# Patient Record
Sex: Female | Born: 2000 | Race: Black or African American | Hispanic: No | Marital: Single | Smoking: Current every day smoker
Health system: Southern US, Community
[De-identification: ages and names within clinical notes are randomized; demographics above are authoritative.]

## PROBLEM LIST (undated history)

## (undated) DIAGNOSIS — J45909 Unspecified asthma, uncomplicated: Secondary | ICD-10-CM

## (undated) DIAGNOSIS — K219 Gastro-esophageal reflux disease without esophagitis: Secondary | ICD-10-CM

## (undated) DIAGNOSIS — D509 Iron deficiency anemia, unspecified: Secondary | ICD-10-CM

## (undated) HISTORY — DX: Hypercalcemia: E83.52

## (undated) HISTORY — DX: Iron deficiency anemia, unspecified: D50.9

---
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
## 2020-04-23 ENCOUNTER — Encounter (HOSPITAL_COMMUNITY): Payer: Self-pay | Admitting: *Deleted

## 2020-04-23 ENCOUNTER — Other Ambulatory Visit: Payer: Self-pay

## 2020-04-23 ENCOUNTER — Ambulatory Visit (INDEPENDENT_AMBULATORY_CARE_PROVIDER_SITE_OTHER): Payer: Medicaid Other

## 2020-04-23 ENCOUNTER — Ambulatory Visit (HOSPITAL_COMMUNITY)
Admission: EM | Admit: 2020-04-23 | Discharge: 2020-04-23 | Disposition: A | Discharge status: Home / Self Care | Payer: Medicaid Other | Attending: Family Medicine | Admitting: Family Medicine

## 2020-04-23 DIAGNOSIS — W19XXXA Unspecified fall, initial encounter: Secondary | ICD-10-CM | POA: Diagnosis not present

## 2020-04-23 DIAGNOSIS — M25562 Pain in left knee: Secondary | ICD-10-CM

## 2020-04-23 DIAGNOSIS — S83002A Unspecified subluxation of left patella, initial encounter: Secondary | ICD-10-CM

## 2020-04-23 LAB — POC URINE PREG, ED: Preg Test, Ur: NEGATIVE

## 2020-04-23 MED ORDER — IBUPROFEN 600 MG PO TABS: 600.0000 mg | ORAL_TABLET | Freq: Four times a day (QID) | ORAL | 0 refills | Status: DC | PRN

## 2020-04-23 NOTE — ED Provider Notes (Signed)
[facility]    CSN: [id number] Arrival date & time: 04/23/20  1702      History   Chief Complaint Chief Complaint  Patient presents with  . Knee Pain    LT    HPI Michelle Mcfarland is a 19 y.o. female.   HPI   Patient states she was up on a ladder, putting up lights lights, 2 days ago.  She fell.  She states that she landed on her left leg and twisted causing her to collapse.  Ever since then she has had pain in her knee.  She states that when she puts weight on it it feels unsteady.  She feels like it is "going in and out of place".  She has never had any knee problems before.  She has been wearing a brace.  She is limited weight bearing.  She used ice initially and then peaked.  She is taking ibuprofen for pain  History reviewed. No pertinent past medical history.  There are no problems to display for this patient.   History reviewed. No pertinent surgical history.  OB History   No obstetric history on file.      Home Medications    Prior to Admission medications   Medication Sig Start Date End Date Taking? Authorizing Provider  ibuprofen (ADVIL) 600 MG tablet Take 1 tablet (600 mg total) by mouth every 6 (six) hours as needed. 04/23/20   Eustace Moore, MD    Family History History reviewed. No pertinent family history.  Social History Social History   Tobacco Use  . Smoking status: Never Smoker  . Smokeless tobacco: Never Used  Vaping Use  . Vaping Use: Never used  Substance Use Topics  . Alcohol use: No  . Drug use: No     Allergies   Patient has no known allergies.   Review of Systems Review of Systems  See HPI Physical Exam Triage Vital Signs ED Triage Vitals [04/23/20 1818]  Enc Vitals Group     BP      Pulse      Resp      Temp      Temp src      SpO2      Weight 160 lb (72.6 kg)     Height 5\' 4"  (1.626 m)     Head Circumference      Peak Flow      Pain Score 9     Pain Loc      Pain Edu?      Excl. in GC?     No data found.  Updated Vital Signs Ht 5\' 4"  (1.626 m)   Wt 72.6 kg   LMP 04/06/2020   BMI 27.46 kg/m       Physical Exam Constitutional:      General: She is not in acute distress.    Appearance: She is well-developed.  HENT:     Head: Normocephalic and atraumatic.  Eyes:     Conjunctiva/sclera: Conjunctivae normal.     Pupils: Pupils are equal, round, and reactive to light.  Cardiovascular:     Rate and Rhythm: Normal rate.  Pulmonary:     Effort: Pulmonary effort is normal. No respiratory distress.  Abdominal:     General: There is no distension.     Palpations: Abdomen is soft.  Musculoskeletal:        General: Normal range of motion.     Cervical back: Normal range of motion.  Comments: Left knee is in a neoprene sleeve.  Upon removal there is no obvious bruising or deformity, mild warmth.  There is mild patellofemoral crepitus.  Patient resists any range of motion.  She has tenderness over the patella, lateral and medial joint lines.  No instability to examination.  Skin:    General: Skin is warm and dry.  Neurological:     Mental Status: She is alert.      UC Treatments / Results  Labs (all labs ordered are listed, but only abnormal results are displayed) Labs Reviewed  POC URINE PREG, ED    EKG   Radiology DG Knee Complete 4 Views Left  Result Date: 04/23/2020 CLINICAL DATA:  Fall, knee pain EXAM: LEFT KNEE - COMPLETE 4+ VIEW COMPARISON:  None. FINDINGS: No evidence of fracture, dislocation, or joint effusion. No evidence of arthropathy or other focal bone abnormality. Soft tissues are unremarkable. IMPRESSION: Negative. Electronically Signed   By: Charlett Nose M.D.   On: 04/23/2020 19:23    Procedures Procedures (including critical care time)  Medications Ordered in UC Medications - No data to display  Initial Impression / Assessment and Plan / UC Course  I have reviewed the triage vital signs and the nursing notes.  Pertinent labs &  imaging results that were available during my care of the patient were reviewed by me and considered in my medical decision making (see chart for details).    History and physical examination support a diagnosis of patellar subluxation, possible dislocation although patient states her knee never "deformed".  It is continuing symptomatic.  Will place into a knee immobilizer and have her follow-up with sports medicine Final Clinical Impressions(s) / UC Diagnoses   Final diagnoses:  Patellar subluxation, left, initial encounter  Acute pain of left knee     Discharge Instructions     Wear brace at all times that you are up and walking May open brace put ice pack on knee 20 minutes every couple of hours Take ibuprofen 3 times a day with food.  This is for pain and swelling Call sports medicine tomorrow to be seen for follow-up     ED Prescriptions    Medication Sig Dispense Auth. Provider   ibuprofen (ADVIL) 600 MG tablet Take 1 tablet (600 mg total) by mouth every 6 (six) hours as needed. 30 tablet Eustace Moore, MD     PDMP not reviewed this encounter.   Eustace Moore, MD 04/23/20 Serena Croissant

## 2020-04-23 NOTE — ED Triage Notes (Signed)
Pt reports 2 days ago she fell off a 2 step ladder while hanging LED lights. Pt presented with a knee sleeve todat . Pt observed to be limping when walking to room,

## 2020-04-23 NOTE — Discharge Instructions (Signed)
Wear brace at all times that you are up and walking May open brace put ice pack on knee 20 minutes every couple of hours Take ibuprofen 3 times a day with food.  This is for pain and swelling Call sports medicine tomorrow to be seen for follow-up

## 2020-07-23 ENCOUNTER — Encounter (HOSPITAL_COMMUNITY): Payer: Self-pay

## 2020-07-23 ENCOUNTER — Other Ambulatory Visit: Payer: Self-pay

## 2020-07-23 ENCOUNTER — Ambulatory Visit (HOSPITAL_COMMUNITY)
Admission: RE | Admit: 2020-07-23 | Discharge: 2020-07-23 | Disposition: A | Discharge status: Home / Self Care | Payer: 59 | Source: Ambulatory Visit | Attending: Urgent Care | Admitting: Urgent Care

## 2020-07-23 VITALS — BP 131/85 | HR 92 | Temp 98.9°F | Resp 17

## 2020-07-23 DIAGNOSIS — M25562 Pain in left knee: Secondary | ICD-10-CM

## 2020-07-23 MED ORDER — NAPROXEN 500 MG PO TABS: 500.0000 mg | ORAL_TABLET | Freq: Two times a day (BID) | ORAL | 0 refills | Status: DC

## 2020-07-23 NOTE — ED Triage Notes (Signed)
Pt presents with left knee pain. States was seen about 2 months ago for same problem and was given knee brace. States knee brace is hurting more than helping now. States ibuprofen is no longer giving relief.

## 2020-07-23 NOTE — ED Provider Notes (Signed)
  [facility]   MRN: [id number] DOB: [birth year]  Subjective:   Michelle Mcfarland is a 20 y.o. female presenting for 33-month history of persistent left knee pain swelling.  Patient states that she has worn the brace and use ibuprofen but has not gotten any relief.  She has difficulty flexing her knee fully.  Also has difficulty with walking.  Initially the injury happened from falling off of a ladder, states that she twisted her knee and also hit it on the way down.  Imaging was negative at her last visit.  Denies any further injury since then.  No current facility-administered medications for this encounter.  Current Outpatient Medications:  .  ibuprofen (ADVIL) 600 MG tablet, Take 1 tablet (600 mg total) by mouth every 6 (six) hours as needed., Disp: 30 tablet, Rfl: 0   No Known Allergies  History reviewed. No pertinent past medical history.   History reviewed. No pertinent surgical history.  History reviewed. No pertinent family history.  Social History   Tobacco Use  . Smoking status: Never Smoker  . Smokeless tobacco: Never Used  Vaping Use  . Vaping Use: Never used  Substance Use Topics  . Alcohol use: No  . Drug use: No    ROS   Objective:   Vitals: BP 131/85 (BP Location: Left Arm)   Pulse 92   Temp 98.9 F (37.2 C) (Oral)   Resp 17   LMP 07/02/2020   SpO2 98%   Physical Exam Constitutional:      General: She is not in acute distress.    Appearance: Normal appearance. She is well-developed. She is not ill-appearing.  HENT:     Head: Normocephalic and atraumatic.     Nose: Nose normal.     Mouth/Throat:     Mouth: Mucous membranes are moist.     Pharynx: Oropharynx is clear.  Eyes:     General: No scleral icterus.    Extraocular Movements: Extraocular movements intact.     Pupils: Pupils are equal, round, and reactive to light.  Cardiovascular:     Rate and Rhythm: Normal rate.  Pulmonary:     Effort: Pulmonary effort is normal.   Musculoskeletal:     Left knee: No swelling, deformity, effusion, erythema, ecchymosis, lacerations, bony tenderness or crepitus. Decreased range of motion (flexion). Tenderness (directly over patella) present over the patellar tendon. No medial joint line or lateral joint line tenderness. Abnormal patellar mobility. Normal alignment and normal meniscus.  Skin:    General: Skin is warm and dry.  Neurological:     General: No focal deficit present.     Mental Status: She is alert and oriented to person, place, and time.     Motor: No weakness.     Deep Tendon Reflexes: Reflexes normal.  Psychiatric:        Mood and Affect: Mood normal.        Behavior: Behavior normal.    Ace wrap applied to left knee.  Assessment and Plan :   PDMP not reviewed this encounter.  1. Acute pain of left knee     Deferred repeat x-ray, recommended further evaluation with MRI or ultrasound during orthopedic practice.  Use naproxen for pain and inflammation. Counseled patient on potential for adverse effects with medications prescribed/recommended today, ER and return-to-clinic precautions discussed, patient verbalized understanding.    Wallis Bamberg, PA-C 07/23/20 1021

## 2020-07-23 NOTE — Discharge Instructions (Signed)
You will need more imaging like an MRI or an ultrasound to look at your knee ligaments, meniscus. But in the meantime, wear the Ace wrap for stability. Reach out to one of the orthopedist practices to get a more in depth imaging study of your knee.

## 2020-07-24 ENCOUNTER — Other Ambulatory Visit: Payer: Self-pay | Admitting: Sports Medicine

## 2020-07-24 DIAGNOSIS — M25362 Other instability, left knee: Secondary | ICD-10-CM

## 2020-08-11 ENCOUNTER — Other Ambulatory Visit: Payer: Self-pay

## 2020-08-11 ENCOUNTER — Ambulatory Visit
Admission: RE | Admit: 2020-08-11 | Discharge: 2020-08-11 | Disposition: A | Discharge status: Home / Self Care | Payer: 59 | Source: Ambulatory Visit | Attending: Sports Medicine | Admitting: Sports Medicine

## 2020-08-11 DIAGNOSIS — M25362 Other instability, left knee: Secondary | ICD-10-CM

## 2020-10-23 ENCOUNTER — Ambulatory Visit (INDEPENDENT_AMBULATORY_CARE_PROVIDER_SITE_OTHER): Payer: 59

## 2020-10-23 ENCOUNTER — Ambulatory Visit (HOSPITAL_COMMUNITY)
Admission: RE | Admit: 2020-10-23 | Discharge: 2020-10-23 | Disposition: A | Discharge status: Home / Self Care | Payer: 59 | Source: Ambulatory Visit | Attending: Physician Assistant | Admitting: Physician Assistant

## 2020-10-23 ENCOUNTER — Encounter (HOSPITAL_COMMUNITY): Payer: Self-pay

## 2020-10-23 ENCOUNTER — Other Ambulatory Visit: Payer: Self-pay

## 2020-10-23 VITALS — BP 101/71 | HR 92 | Temp 98.8°F | Resp 18

## 2020-10-23 DIAGNOSIS — R042 Hemoptysis: Secondary | ICD-10-CM

## 2020-10-23 DIAGNOSIS — R0602 Shortness of breath: Secondary | ICD-10-CM

## 2020-10-23 DIAGNOSIS — R059 Cough, unspecified: Secondary | ICD-10-CM | POA: Diagnosis not present

## 2020-10-23 DIAGNOSIS — J329 Chronic sinusitis, unspecified: Secondary | ICD-10-CM | POA: Diagnosis not present

## 2020-10-23 DIAGNOSIS — J40 Bronchitis, not specified as acute or chronic: Secondary | ICD-10-CM | POA: Insufficient documentation

## 2020-10-23 LAB — COMPREHENSIVE METABOLIC PANEL
ALT: 19 U/L (ref 0–44)
AST: 21 U/L (ref 15–41)
Albumin: 3.8 g/dL (ref 3.5–5.0)
Alkaline Phosphatase: 75 U/L (ref 38–126)
Anion gap: 5 (ref 5–15)
BUN: 10 mg/dL (ref 6–20)
CO2: 25 mmol/L (ref 22–32)
Calcium: 9.1 mg/dL (ref 8.9–10.3)
Chloride: 106 mmol/L (ref 98–111)
Creatinine, Ser: 0.77 mg/dL (ref 0.44–1.00)
GFR, Estimated: >60 mL/min (ref >60)
Glucose, Bld: 83 mg/dL (ref 70–99)
Potassium: 4 mmol/L (ref 3.5–5.1)
Sodium: 136 mmol/L (ref 135–145)
Total Bilirubin: 1 mg/dL (ref 0.3–1.2)
Total Protein: 7.4 g/dL (ref 6.5–8.1)

## 2020-10-23 LAB — CBC WITH DIFFERENTIAL/PLATELET
Abs Immature Granulocytes: 0.02 10*3/uL (ref 0.00–0.07)
Basophils Absolute: 0 10*3/uL (ref 0.0–0.1)
Basophils Relative: 1 %
Eosinophils Absolute: 0.2 10*3/uL (ref 0.0–0.5)
Eosinophils Relative: 4 %
HCT: 36 % (ref 36.0–46.0)
Hemoglobin: 10.6 g/dL — ABNORMAL LOW (ref 12.0–15.0)
Immature Granulocytes: 0 %
Lymphocytes Relative: 35 %
Lymphs Abs: 1.9 10*3/uL (ref 0.7–4.0)
MCH: 19.9 pg — ABNORMAL LOW (ref 26.0–34.0)
MCHC: 29.4 g/dL — ABNORMAL LOW (ref 30.0–36.0)
MCV: 67.5 fL — ABNORMAL LOW (ref 80.0–100.0)
Monocytes Absolute: 0.4 10*3/uL (ref 0.1–1.0)
Monocytes Relative: 7 %
Neutro Abs: 2.8 10*3/uL (ref 1.7–7.7)
Neutrophils Relative %: 53 %
Platelets: 399 10*3/uL (ref 150–400)
RBC: 5.33 MIL/uL — ABNORMAL HIGH (ref 3.87–5.11)
RDW: 16.7 % — ABNORMAL HIGH (ref 11.5–15.5)
WBC: 5.4 10*3/uL (ref 4.0–10.5)
nRBC: 0 % (ref 0.0–0.2)

## 2020-10-23 MED ORDER — AMOXICILLIN-POT CLAVULANATE 875-125 MG PO TABS: 1.0000 | ORAL_TABLET | Freq: Two times a day (BID) | ORAL | 0 refills | Status: DC

## 2020-10-23 MED ORDER — ALBUTEROL SULFATE HFA 108 (90 BASE) MCG/ACT IN AERS: 1.0000 | INHALATION_SPRAY | Freq: Four times a day (QID) | RESPIRATORY_TRACT | 0 refills | Status: DC | PRN

## 2020-10-23 MED ORDER — PREDNISONE 20 MG PO TABS: 40.0000 mg | ORAL_TABLET | Freq: Every day | ORAL | 0 refills | Status: AC

## 2020-10-23 MED ORDER — PROMETHAZINE-DM 6.25-15 MG/5ML PO SYRP: 5.0000 mL | ORAL_SOLUTION | Freq: Every evening | ORAL | 0 refills | Status: DC | PRN

## 2020-10-23 NOTE — ED Triage Notes (Signed)
Pt c/o cough, sob and coughing up blood X 1 week. She states she has chest pain when coughing.

## 2020-10-23 NOTE — Discharge Instructions (Addendum)
Use albuterol for acute shortness of breath and coughing fits.  Take Augmentin twice daily to cover for infection.  You should take prednisone with breakfast.  While you are taking prednisone no ibuprofen, aspirin, naproxen/Aleve.  Take Promethazine DM at night to help with cough.  This can make you sleepy so do not drive or drink alcohol with it.  Make sure you are drinking plenty of fluid.  If you have any persistent or worsening symptoms you need to be reevaluated.

## 2020-10-23 NOTE — ED Provider Notes (Signed)
[facility]    CSN: [id number] Arrival date & time: 10/23/20  1359      History   Chief Complaint Chief Complaint  Patient presents with  . Appointment  . Cough  . Shortness of Breath    HPI Michelle Mcfarland is a 20 y.o. female.   Patient presents today with a 1 week history of cough.  Reports coughing fits that are severe and interfere with her ability perform daily activities.  She denies any history of asthma or pulmonary condition.  She denies associated fever, chest pain, palpitations, dizziness, syncope, congestion.  She does report associated shortness of breath particularly with coughing as well as wheezing.  She denies any pleuritic chest pain.  She does report hemoptysis which she describes as intermittent episodes of coughing up blood which she describes as enough to cover the palm of her hand.  Last episode was approximately 36 hours ago.  She denies history of bleeding disorder or taking blood thinning medications.  Denies any personal or family history of VTE event, exogenous hormone use, recent travel, immobilization, surgical procedure.  She has tried NyQuil and DayQuil without improvement of symptoms.  She is requesting a work excuse note today.     History reviewed. No pertinent past medical history.  There are no problems to display for this patient.   History reviewed. No pertinent surgical history.  OB History   No obstetric history on file.      Home Medications    Prior to Admission medications   Medication Sig Start Date End Date Taking? Authorizing Provider  albuterol (VENTOLIN HFA) 108 (90 Base) MCG/ACT inhaler Inhale 1-2 puffs into the lungs every 6 (six) hours as needed for wheezing or shortness of breath. 10/23/20  Yes Lukis Bunt K, PA-C  amoxicillin-clavulanate (AUGMENTIN) 875-125 MG tablet Take 1 tablet by mouth every 12 (twelve) hours. 10/23/20  Yes Shuntel Fishburn K, PA-C  predniSONE (DELTASONE) 20 MG tablet Take 2 tablets (40 mg total)  by mouth daily with breakfast for 4 days. 10/23/20 10/27/20 Yes Ralpheal Zappone K, PA-C  promethazine-dextromethorphan (PROMETHAZINE-DM) 6.25-15 MG/5ML syrup Take 5 mLs by mouth at bedtime as needed for cough. 10/23/20  Yes Duana Benedict, Noberto Retort, PA-C    Family History History reviewed. No pertinent family history.  Social History Social History   Tobacco Use  . Smoking status: Never Smoker  . Smokeless tobacco: Never Used  Vaping Use  . Vaping Use: Never used  Substance Use Topics  . Alcohol use: No  . Drug use: No     Allergies   Patient has no known allergies.   Review of Systems Review of Systems  Constitutional: Negative for activity change, appetite change, fatigue and fever.  HENT: Positive for sore throat. Negative for congestion, sinus pressure and sneezing.   Respiratory: Positive for cough, shortness of breath and wheezing. Negative for chest tightness.   Cardiovascular: Negative for chest pain and palpitations.  Gastrointestinal: Negative for abdominal pain, diarrhea, nausea and vomiting.  Neurological: Negative for dizziness, light-headedness and headaches.     Physical Exam Triage Vital Signs ED Triage Vitals  Enc Vitals Group     BP 10/23/20 1427 101/71     Pulse Rate 10/23/20 1424 92     Resp 10/23/20 1424 18     Temp 10/23/20 1424 98.8 F (37.1 C)     Temp Source 10/23/20 1424 Oral     SpO2 10/23/20 1424 100 %     Weight --  Height --      Head Circumference --      Peak Flow --      Pain Score 10/23/20 1426 8     Pain Loc --      Pain Edu? --      Excl. in GC? --    No data found.  Updated Vital Signs BP 101/71 (BP Location: Left Arm)   Pulse 92   Temp 98.8 F (37.1 C) (Oral)   Resp 18   LMP 10/14/2020 (Exact Date)   SpO2 100%   Visual Acuity Right Eye Distance:   Left Eye Distance:   Bilateral Distance:    Right Eye Near:   Left Eye Near:    Bilateral Near:     Physical Exam Vitals reviewed.  Constitutional:      General: She is  awake. She is not in acute distress.    Appearance: Normal appearance. She is not ill-appearing.     Comments: Very pleasant female appears stated age in no acute distress  HENT:     Head: Normocephalic and atraumatic.     Right Ear: Tympanic membrane, ear canal and external ear normal. Tympanic membrane is not erythematous or bulging.     Left Ear: Tympanic membrane, ear canal and external ear normal. Tympanic membrane is not erythematous or bulging.     Nose:     Right Sinus: No maxillary sinus tenderness or frontal sinus tenderness.     Left Sinus: No maxillary sinus tenderness or frontal sinus tenderness.     Mouth/Throat:     Pharynx: Uvula midline. Posterior oropharyngeal erythema present. No oropharyngeal exudate.     Comments: Mild erythema posterior oropharynx. Cardiovascular:     Rate and Rhythm: Normal rate and regular rhythm.     Heart sounds: No murmur heard.   Pulmonary:     Effort: Pulmonary effort is normal.     Breath sounds: Normal breath sounds. No wheezing, rhonchi or rales.     Comments: Clear to auscultation bilaterally Lymphadenopathy:     Head:     Right side of head: No submental, submandibular or tonsillar adenopathy.     Left side of head: No submental, submandibular or tonsillar adenopathy.     Cervical: No cervical adenopathy.  Psychiatric:        Behavior: Behavior is cooperative.      UC Treatments / Results  Labs (all labs ordered are listed, but only abnormal results are displayed) Labs Reviewed  CBC WITH DIFFERENTIAL/PLATELET  COMPREHENSIVE METABOLIC PANEL    EKG   Radiology DG Chest 2 View  Result Date: 10/23/2020 CLINICAL DATA:  Hemoptysis.  Shortness of breath.  Cough. EXAM: CHEST - 2 VIEW COMPARISON:  No prior. FINDINGS: Mediastinum hilar structures normal. Heart size normal. Faint nodularity noted over the left lung base appears to represent overlying vessels. No acute infiltrate. No pleural effusion or pneumothorax. IMPRESSION: No  acute abnormality. Electronically Signed   By: Maisie Fus  Register   On: 10/23/2020 15:41    Procedures Procedures (including critical care time)  Medications Ordered in UC Medications - No data to display  Initial Impression / Assessment and Plan / UC Course  I have reviewed the triage vital signs and the nursing notes.  Pertinent labs & imaging results that were available during my care of the patient were reviewed by me and considered in my medical decision making (see chart for details).     Checks x-ray showed no acute abnormality.  Suspect bleeding  is from abrasion due to severity of cough.  Patient has not had any recent hemoptysis.  CBC and CMP obtained today-results pending.  Given patient's vital signs are stable will defer D-dimer and additional work-up at this time.  Discussed with Dr. Leonides Grills who agreed with plan.  We will start patient on prednisone with instructions to take NSAIDs.  She was given albuterol inhaler with proper instruction on how to use this medication during visit today.  She was prescribed Augmentin.  Recommended Promethazine DM for nocturnal cough symptoms and she was instructed not to drive or drink alcohol with this medication as drowsiness is a common side effect.  She was provided work excuse note.  Discussed that if she has any worsening symptoms she is to go to the emergency room to which she expressed understanding.  Final Clinical Impressions(s) / UC Diagnoses   Final diagnoses:  Sinobronchitis  Cough  Shortness of breath  Bloody sputum     Discharge Instructions     Use albuterol for acute shortness of breath and coughing fits.  Take Augmentin twice daily to cover for infection.  You should take prednisone with breakfast.  While you are taking prednisone no ibuprofen, aspirin, naproxen/Aleve.  Take Promethazine DM at night to help with cough.  This can make you sleepy so do not drive or drink alcohol with it.  Make sure you are drinking plenty of  fluid.  If you have any persistent or worsening symptoms you need to be reevaluated.    ED Prescriptions    Medication Sig Dispense Auth. Provider   amoxicillin-clavulanate (AUGMENTIN) 875-125 MG tablet Take 1 tablet by mouth every 12 (twelve) hours. 14 tablet Jeryl Umholtz K, PA-C   predniSONE (DELTASONE) 20 MG tablet Take 2 tablets (40 mg total) by mouth daily with breakfast for 4 days. 8 tablet Glynn Freas K, PA-C   promethazine-dextromethorphan (PROMETHAZINE-DM) 6.25-15 MG/5ML syrup Take 5 mLs by mouth at bedtime as needed for cough. 118 mL Tryton Bodi K, PA-C   albuterol (VENTOLIN HFA) 108 (90 Base) MCG/ACT inhaler Inhale 1-2 puffs into the lungs every 6 (six) hours as needed for wheezing or shortness of breath. 8 g Chardai Gangemi, Noberto Retort, PA-C     PDMP not reviewed this encounter.   Jeani Hawking, PA-C 10/23/20 1556

## 2020-10-26 ENCOUNTER — Encounter (HOSPITAL_BASED_OUTPATIENT_CLINIC_OR_DEPARTMENT_OTHER): Payer: Self-pay | Admitting: Emergency Medicine

## 2020-10-26 ENCOUNTER — Emergency Department (HOSPITAL_BASED_OUTPATIENT_CLINIC_OR_DEPARTMENT_OTHER)
Admission: EM | Admit: 2020-10-26 | Discharge: 2020-10-26 | Disposition: A | Discharge status: Home / Self Care | Payer: 59 | Attending: Emergency Medicine | Admitting: Emergency Medicine

## 2020-10-26 ENCOUNTER — Other Ambulatory Visit: Payer: Self-pay

## 2020-10-26 DIAGNOSIS — Y990 Civilian activity done for income or pay: Secondary | ICD-10-CM | POA: Diagnosis not present

## 2020-10-26 DIAGNOSIS — R0602 Shortness of breath: Secondary | ICD-10-CM | POA: Insufficient documentation

## 2020-10-26 DIAGNOSIS — R0789 Other chest pain: Secondary | ICD-10-CM | POA: Diagnosis not present

## 2020-10-26 DIAGNOSIS — Z7722 Contact with and (suspected) exposure to environmental tobacco smoke (acute) (chronic): Secondary | ICD-10-CM | POA: Insufficient documentation

## 2020-10-26 DIAGNOSIS — R059 Cough, unspecified: Secondary | ICD-10-CM | POA: Insufficient documentation

## 2020-10-26 DIAGNOSIS — J45909 Unspecified asthma, uncomplicated: Secondary | ICD-10-CM | POA: Insufficient documentation

## 2020-10-26 NOTE — ED Triage Notes (Signed)
Per EMS: Patient reports to the ER from [place] for Asthma exacerbation. Patient has reportedly used her inhaler x10 this morning.

## 2020-10-26 NOTE — ED Provider Notes (Signed)
[facility] EMERGENCY DEPT Provider Note   CSN: [id number] Arrival date & time: 10/26/20  1233     History Chief Complaint  Patient presents with  . Asthma    Michelle Mcfarland is a 20 y.o. female.  She was at work today when she started coughing and felt like she could not breathe.  She used her inhaler without relief.  EMS was called and they found her with respiratory wheezing with a pulse ox 100%.  She used her inhaler for 10 puffs.  She still complaining of some tightness in her chest.  She was at urgent care 2 days ago for cough and shortness of breath and they treated her with inhaler, antibiotics, prednisone, and Promethazine DM cough syrup.  She had a chest x-ray done and lab work.  The history is provided by the patient.  Shortness of Breath Severity:  Severe Onset quality:  Sudden Progression:  Improving Chronicity:  Recurrent Context: smoke exposure   Relieved by:  Nothing Worsened by:  Coughing Ineffective treatments:  Inhaler Associated symptoms: chest pain, cough and wheezing   Associated symptoms: no abdominal pain, no fever, no headaches, no neck pain, no rash, no sore throat, no sputum production and no vomiting   Risk factors: no tobacco use        History reviewed. No pertinent past medical history.  There are no problems to display for this patient.   History reviewed. No pertinent surgical history.   OB History   No obstetric history on file.     No family history on file.  Social History   Tobacco Use  . Smoking status: Never Smoker  . Smokeless tobacco: Never Used  Vaping Use  . Vaping Use: Never used  Substance Use Topics  . Alcohol use: No  . Drug use: No    Home Medications Prior to Admission medications   Medication Sig Start Date End Date Taking? Authorizing Provider  albuterol (VENTOLIN HFA) 108 (90 Base) MCG/ACT inhaler Inhale 1-2 puffs into the lungs every 6 (six) hours as needed for wheezing or shortness of  breath. 10/23/20   Raspet, Noberto Retort, PA-C  amoxicillin-clavulanate (AUGMENTIN) 875-125 MG tablet Take 1 tablet by mouth every 12 (twelve) hours. 10/23/20   Raspet, Noberto Retort, PA-C  predniSONE (DELTASONE) 20 MG tablet Take 2 tablets (40 mg total) by mouth daily with breakfast for 4 days. 10/23/20 10/27/20  Raspet, Noberto Retort, PA-C  promethazine-dextromethorphan (PROMETHAZINE-DM) 6.25-15 MG/5ML syrup Take 5 mLs by mouth at bedtime as needed for cough. 10/23/20   Raspet, Noberto Retort, PA-C    Allergies    Patient has no known allergies.  Review of Systems   Review of Systems  Constitutional: Negative for fever.  HENT: Negative for sore throat.   Eyes: Negative for visual disturbance.  Respiratory: Positive for cough, shortness of breath and wheezing. Negative for sputum production.   Cardiovascular: Positive for chest pain.  Gastrointestinal: Negative for abdominal pain and vomiting.  Genitourinary: Negative for dysuria.  Musculoskeletal: Negative for neck pain.  Skin: Negative for rash.  Neurological: Negative for headaches.    Physical Exam Updated Vital Signs BP 130/89 (BP Location: Right Arm)   Pulse (!) 102   Temp 98.5 F (36.9 C) (Oral)   Resp (!) 24   LMP 10/14/2020 (Exact Date)   SpO2 100% Comment: Simultaneous filing. User may not have seen previous data.  Physical Exam Vitals and nursing note reviewed.  Constitutional:      General: She is not  in acute distress.    Appearance: Normal appearance. She is well-developed.  HENT:     Head: Normocephalic and atraumatic.  Eyes:     Conjunctiva/sclera: Conjunctivae normal.  Cardiovascular:     Rate and Rhythm: Normal rate and regular rhythm.     Heart sounds: No murmur heard.   Pulmonary:     Effort: Pulmonary effort is normal. No respiratory distress.     Breath sounds: Normal breath sounds.  Abdominal:     Palpations: Abdomen is soft.     Tenderness: There is no abdominal tenderness. There is no guarding or rebound.  Musculoskeletal:         General: No deformity or signs of injury. Normal range of motion.     Cervical back: Neck supple.     Right lower leg: No edema.     Left lower leg: No edema.  Skin:    General: Skin is warm and dry.  Neurological:     General: No focal deficit present.     Mental Status: She is alert.     ED Results / Procedures / Treatments   Labs (all labs ordered are listed, but only abnormal results are displayed) Labs Reviewed - No data to display  EKG None  Radiology No results found.  Procedures Procedures   Medications Ordered in ED Medications - No data to display  ED Course  I have reviewed the triage vital signs and the nursing notes.  Pertinent labs & imaging results that were available during my care of the patient were reviewed by me and considered in my medical decision making (see chart for details).  Clinical Course as of 10/26/20 1731  Fri Oct 26, 2020  1343 Patient's mother is here now.  I reviewed the labs that she had and the chest x-ray from a few days ago.  Her lungs are clear and her sats 100%.  She appears in no distress.  Mom is comfortable with her returning home and will give her a note for work for tomorrow 2. [MB]    Clinical Course User Index [MB] Terrilee Files, MD   MDM Rules/Calculators/A&P    20 year old with history of asthma here with increased shortness of breath after smoke exposure at work.  She had a fairly significant work-up just 2 days prior at urgent care with labs and chest x-ray.  Her symptoms improved with nebulizer treatments in the ambulance.  She is already on steroids and antibiotics.  Clear lungs and sats 100% on room air.  Do not feel she needs further work-up at this time.  Differential includes asthma, reactive airway disease, less likely bronchitis, pneumonia, pneumothorax   Final Clinical Impression(s) / ED Diagnoses Final diagnoses:  SOB (shortness of breath)    Rx / DC Orders ED Discharge Orders    None        Terrilee Files, MD 10/26/20 1733

## 2020-10-26 NOTE — Discharge Instructions (Signed)
Please continue the medications that they started you on an urgent care for your cough and shortness of breath.  Avoid smoke exposure.  Stay well-hydrated.  Follow-up with your doctor or return to the emergency department for any worsening or concerning symptoms

## 2020-10-26 NOTE — ED Notes (Signed)
Patient arrives with shob and DOE via EMS. Patient ambulated to triage w/o obvious signs of distress. Patient BBS insp wheezing. RA - 100%. Per EMS, patient took "10 puffs" of her inhaler. No other resp meds given by EMS. Patient known hx of Asthma.

## 2020-10-26 NOTE — ED Notes (Signed)
Pt from work , shortness of breath and lightheaded. Recent Dx bronchitis.

## 2020-11-22 ENCOUNTER — Ambulatory Visit (HOSPITAL_COMMUNITY)
Admission: RE | Admit: 2020-11-22 | Discharge: 2020-11-22 | Disposition: A | Discharge status: Home / Self Care | Payer: Medicaid Other | Source: Ambulatory Visit | Attending: Urgent Care | Admitting: Urgent Care

## 2020-11-22 ENCOUNTER — Other Ambulatory Visit: Payer: Self-pay

## 2020-11-22 ENCOUNTER — Ambulatory Visit (INDEPENDENT_AMBULATORY_CARE_PROVIDER_SITE_OTHER): Payer: Medicaid Other

## 2020-11-22 ENCOUNTER — Encounter (HOSPITAL_COMMUNITY): Payer: Self-pay

## 2020-11-22 VITALS — BP 125/72 | HR 94 | Temp 99.0°F | Resp 17

## 2020-11-22 DIAGNOSIS — Z20822 Contact with and (suspected) exposure to covid-19: Secondary | ICD-10-CM | POA: Insufficient documentation

## 2020-11-22 DIAGNOSIS — R059 Cough, unspecified: Secondary | ICD-10-CM

## 2020-11-22 DIAGNOSIS — R042 Hemoptysis: Secondary | ICD-10-CM | POA: Diagnosis present

## 2020-11-22 DIAGNOSIS — J4530 Mild persistent asthma, uncomplicated: Secondary | ICD-10-CM | POA: Insufficient documentation

## 2020-11-22 HISTORY — DX: Unspecified asthma, uncomplicated: J45.909

## 2020-11-22 MED ORDER — ALBUTEROL SULFATE HFA 108 (90 BASE) MCG/ACT IN AERS: 1.0000 | INHALATION_SPRAY | Freq: Four times a day (QID) | RESPIRATORY_TRACT | 0 refills | Status: DC | PRN

## 2020-11-22 MED ORDER — PREDNISONE 20 MG PO TABS: ORAL_TABLET | ORAL | 0 refills | Status: DC

## 2020-11-22 MED ORDER — BENZONATATE 100 MG PO CAPS: 100.0000 mg | ORAL_CAPSULE | Freq: Three times a day (TID) | ORAL | 0 refills | Status: DC | PRN

## 2020-11-22 NOTE — ED Provider Notes (Signed)
[facility]   MRN: [id number] DOB: 02/14/2001  Subjective:   Michelle Mcfarland is a 20 y.o. female presenting for 1 month history of persistent cough, chest congestion, throat congestion, stuffy nose.  She was last seen in early May and underwent a course of Augmentin.  She also has been using a cough suppression medication with minimal relief.  States that she ran out of her inhaler and needs a refill.  Feels like her cough is worse including hemoptysis.  Denies active chest pain or shortness of breath.  She is not a smoker, no alcohol use.  She is not COVID vaccinated.  Would like to make sure she does not have this either.  No current facility-administered medications for this encounter.  Current Outpatient Medications:  .  albuterol (VENTOLIN HFA) 108 (90 Base) MCG/ACT inhaler, Inhale 1-2 puffs into the lungs every 6 (six) hours as needed for wheezing or shortness of breath., Disp: 8 g, Rfl: 0 .  amoxicillin-clavulanate (AUGMENTIN) 875-125 MG tablet, Take 1 tablet by mouth every 12 (twelve) hours., Disp: 14 tablet, Rfl: 0 .  promethazine-dextromethorphan (PROMETHAZINE-DM) 6.25-15 MG/5ML syrup, Take 5 mLs by mouth at bedtime as needed for cough., Disp: 118 mL, Rfl: 0   No Known Allergies  Past Medical History:  Diagnosis Date  . Asthma      History reviewed. No pertinent surgical history.  History reviewed. No pertinent family history.  Social History   Tobacco Use  . Smoking status: Never Smoker  . Smokeless tobacco: Never Used  Vaping Use  . Vaping Use: Never used  Substance Use Topics  . Alcohol use: No  . Drug use: No    ROS   Objective:   Vitals: BP 125/72 (BP Location: Right Arm)   Pulse 94   Temp 99 F (37.2 C) (Oral)   Resp 17   LMP 11/13/2020 (Exact Date)   SpO2 99%   Physical Exam Constitutional:      General: She is not in acute distress.    Appearance: Normal appearance. She is well-developed. She is not ill-appearing,  toxic-appearing or diaphoretic.  HENT:     Head: Normocephalic and atraumatic.     Nose: Congestion present. No rhinorrhea.     Comments: No sinus tenderness.    Mouth/Throat:     Mouth: Mucous membranes are moist.  Eyes:     Extraocular Movements: Extraocular movements intact.     Pupils: Pupils are equal, round, and reactive to light.  Cardiovascular:     Rate and Rhythm: Normal rate and regular rhythm.     Pulses: Normal pulses.     Heart sounds: Normal heart sounds. No murmur heard. No friction rub. No gallop.   Pulmonary:     Effort: Pulmonary effort is normal. No respiratory distress.     Breath sounds: Normal breath sounds. No stridor. No wheezing, rhonchi or rales.  Skin:    General: Skin is warm and dry.     Findings: No rash.  Neurological:     Mental Status: She is alert and oriented to person, place, and time.  Psychiatric:        Mood and Affect: Mood normal.        Behavior: Behavior normal.        Thought Content: Thought content normal.     DG Chest 2 View  Result Date: 11/22/2020 CLINICAL DATA:  Hemoptysis EXAM: CHEST - 2 VIEW COMPARISON:  10/23/2020 FINDINGS: The heart size and mediastinal contours are  within normal limits. Both lungs are clear. The visualized skeletal structures are unremarkable. IMPRESSION: No active cardiopulmonary disease. Electronically Signed   By: Alcide Clever M.D.   On: 11/22/2020 17:31    Assessment and Plan :   PDMP not reviewed this encounter.  1. Hemoptysis   2. Cough   3. Mild persistent asthma without complication     Will manage with an oral prednisone course in light of her asthma and a negative chest x-ray.  Refilled the albuterol inhaler.  Use supportive care otherwise.  Hold off on using another round of antibiotics as she already underwent Augmentin and symptoms persisted. Counseled patient on potential for adverse effects with medications prescribed/recommended today, ER and return-to-clinic precautions discussed,  patient verbalized understanding.    Wallis Bamberg, [place] 11/22/20 6644

## 2020-11-22 NOTE — ED Triage Notes (Signed)
Pt in with c/o coughing up blood and a refill for her asthma inhaler that has been going on for approx 1 month    Pt states that when she coughs a lot is when she usually starts to cough up blood

## 2020-11-23 LAB — SARS CORONAVIRUS 2 (TAT 6-24 HRS): SARS Coronavirus 2: NEGATIVE

## 2021-03-12 ENCOUNTER — Other Ambulatory Visit: Payer: Self-pay

## 2021-03-12 ENCOUNTER — Emergency Department (HOSPITAL_COMMUNITY)
Admission: EM | Admit: 2021-03-12 | Discharge: 2021-03-13 | Disposition: A | Discharge status: Home / Self Care | Payer: Medicaid Other | Attending: Emergency Medicine | Admitting: Emergency Medicine

## 2021-03-12 ENCOUNTER — Ambulatory Visit (HOSPITAL_COMMUNITY)
Admission: RE | Admit: 2021-03-12 | Discharge: 2021-03-12 | Disposition: A | Discharge status: Home / Self Care | Payer: Medicaid Other | Source: Ambulatory Visit | Attending: Physician Assistant | Admitting: Physician Assistant

## 2021-03-12 ENCOUNTER — Encounter (HOSPITAL_COMMUNITY): Payer: Self-pay

## 2021-03-12 VITALS — BP 120/80 | HR 87 | Temp 99.5°F | Resp 17

## 2021-03-12 DIAGNOSIS — R112 Nausea with vomiting, unspecified: Secondary | ICD-10-CM | POA: Insufficient documentation

## 2021-03-12 DIAGNOSIS — R109 Unspecified abdominal pain: Secondary | ICD-10-CM | POA: Diagnosis present

## 2021-03-12 DIAGNOSIS — R1011 Right upper quadrant pain: Secondary | ICD-10-CM | POA: Diagnosis not present

## 2021-03-12 DIAGNOSIS — R197 Diarrhea, unspecified: Secondary | ICD-10-CM | POA: Insufficient documentation

## 2021-03-12 DIAGNOSIS — R1013 Epigastric pain: Secondary | ICD-10-CM

## 2021-03-12 LAB — LIPASE, BLOOD
Lipase: 20 U/L (ref 11–51)
Lipase: 18 U/L (ref 11–51)

## 2021-03-12 LAB — CBC WITH DIFFERENTIAL/PLATELET
Abs Immature Granulocytes: 0.06 10*3/uL (ref 0.00–0.07)
Basophils Absolute: 0 10*3/uL (ref 0.0–0.1)
Basophils Relative: 0 %
Eosinophils Absolute: 0 10*3/uL (ref 0.0–0.5)
Eosinophils Relative: 0 %
HCT: 37.4 % (ref 36.0–46.0)
Hemoglobin: 11.3 g/dL — ABNORMAL LOW (ref 12.0–15.0)
Immature Granulocytes: 0 %
Lymphocytes Relative: 15 %
Lymphs Abs: 2 10*3/uL (ref 0.7–4.0)
MCH: 19.3 pg — ABNORMAL LOW (ref 26.0–34.0)
MCHC: 30.2 g/dL (ref 30.0–36.0)
MCV: 63.9 fL — ABNORMAL LOW (ref 80.0–100.0)
Monocytes Absolute: 0.9 10*3/uL (ref 0.1–1.0)
Monocytes Relative: 6 %
Neutro Abs: 10.8 10*3/uL — ABNORMAL HIGH (ref 1.7–7.7)
Neutrophils Relative %: 79 %
Platelets: 435 10*3/uL — ABNORMAL HIGH (ref 150–400)
RBC: 5.85 MIL/uL — ABNORMAL HIGH (ref 3.87–5.11)
RDW: 18.3 % — ABNORMAL HIGH (ref 11.5–15.5)
WBC: 13.7 10*3/uL — ABNORMAL HIGH (ref 4.0–10.5)
nRBC: 0 % (ref 0.0–0.2)

## 2021-03-12 LAB — COMPREHENSIVE METABOLIC PANEL
ALT: 15 U/L (ref 0–44)
AST: 19 U/L (ref 15–41)
Albumin: 4.3 g/dL (ref 3.5–5.0)
Alkaline Phosphatase: 78 U/L (ref 38–126)
Anion gap: 11 (ref 5–15)
BUN: 9 mg/dL (ref 6–20)
CO2: 25 mmol/L (ref 22–32)
Calcium: 10.7 mg/dL — ABNORMAL HIGH (ref 8.9–10.3)
Chloride: 100 mmol/L (ref 98–111)
Creatinine, Ser: 0.86 mg/dL (ref 0.44–1.00)
GFR, Estimated: >60 mL/min (ref >60)
Glucose, Bld: 90 mg/dL (ref 70–99)
Potassium: 2.9 mmol/L — ABNORMAL LOW (ref 3.5–5.1)
Sodium: 136 mmol/L (ref 135–145)
Total Bilirubin: 1.4 mg/dL — ABNORMAL HIGH (ref 0.3–1.2)
Total Protein: 8.2 g/dL — ABNORMAL HIGH (ref 6.5–8.1)

## 2021-03-12 LAB — URINALYSIS, ROUTINE W REFLEX MICROSCOPIC
Glucose, UA: NEGATIVE mg/dL
Ketones, ur: 15 mg/dL — AB
Nitrite: NEGATIVE
Protein, ur: 30 mg/dL — AB
Specific Gravity, Urine: >1.03 — ABNORMAL HIGH (ref 1.005–1.030)
pH: 5.5 (ref 5.0–8.0)

## 2021-03-12 LAB — POCT URINALYSIS DIPSTICK, ED / UC
Glucose, UA: NEGATIVE mg/dL
Hgb urine dipstick: NEGATIVE
Ketones, ur: 80 mg/dL — AB
Nitrite: NEGATIVE
Protein, ur: 30 mg/dL — AB
Specific Gravity, Urine: 1.025 (ref 1.005–1.030)
Urobilinogen, UA: 1 mg/dL (ref 0.0–1.0)
pH: 7 (ref 5.0–8.0)

## 2021-03-12 LAB — URINALYSIS, MICROSCOPIC (REFLEX): Squamous Epithelial / HPF: >50 (ref 0–5)

## 2021-03-12 LAB — POC URINE PREG, ED: Preg Test, Ur: NEGATIVE

## 2021-03-12 MED ORDER — ONDANSETRON 4 MG PO TBDP
4.0000 mg | ORAL_TABLET | Freq: Once | ORAL | Status: AC
  Administered 2021-03-12: 4 mg via ORAL

## 2021-03-12 MED ORDER — ONDANSETRON 4 MG PO TBDP
ORAL_TABLET | ORAL | Status: AC
  Filled 2021-03-12: qty 1

## 2021-03-12 NOTE — ED Provider Notes (Addendum)
Emergency Medicine Provider Triage Evaluation Note  Michelle Mcfarland , a 20 y.o. female  was evaluated in triage.  Pt complains of upper abdominal pain for the past 4 days.  So she had a nausea and vomiting, last vomited around 6 PM today. Pain worse with eating and after vomiting. Was seen at [facility] today and sent to ED for eval, had cbc and cmp done there.  Review of Systems  Positive: Abdominal pain, nausea, vomiting Negative: Diarrhea, constipation  Physical Exam  BP (!) 142/98 (BP Location: Left Arm)   Pulse 71   Temp 98.9 F (37.2 C) (Oral)   Resp 18   LMP 02/26/2021   SpO2 100%  Gen:   Awake, no distress   Resp:  Normal effort  MSK:   Moves extremities without difficulty  Other:    Medical Decision Making  Medically screening exam initiated at 9:02 PM.  Appropriate orders placed.  Michelle Mcfarland was informed that the remainder of the evaluation will be completed by another provider, this initial triage assessment does not replace that evaluation, and the importance of remaining in the ED until their evaluation is complete.     Michelle Mcfarland T, PA-C 03/12/21 2103    Michelle Mcfarland T, PA-C 03/12/21 2103    Tegeler, Canary Brim, MD 03/12/21 [phone]

## 2021-03-12 NOTE — ED Triage Notes (Signed)
Pt c/o abd pains with n/v x 4 days. Reports had diarrhea the first day but none since. Tried Pepto, TUMS, bread, soup etc with no relief.

## 2021-03-12 NOTE — ED Provider Notes (Signed)
[facility]    CSN: [id number] Arrival date & time: 03/12/21  1839      History   Chief Complaint Chief Complaint  Patient presents with   Abdominal Pain   Emesis    HPI Michelle Mcfarland is a 20 y.o. female.   Patient presents today with a 3 to 4-day history of GI symptoms.  Reports nausea and vomiting with last episode of emesis approximately 2 hours ago described as stomach acid.  She was initially having diarrhea but then has been eating less and so has not had bowel movements recently.  Reports she is passing gas regularly.  She denies any known sick contacts.  Denies any recent travel, antibiotic use, medication changes, suspicious food intake.  She denies any fever, congestion, URI symptoms.  She denies history of gastrointestinal disorder or pancreatitis.  She does report some alcohol use prior to symptom onset but reports this was not excessive.  She denies any GLP-1 use.  Pain is rated 8 on a 0-10 pain scale, worse in upper abdomen but generalized throughout abdomen, described as cramping/aching, no alleviating factors identified.  She has tried Pepto-Bismol, Tums, bland diet without improvement of symptoms.  Denies previous abdominal surgery and still has gallbladder and appendix.  She has no concern for pregnancy but is open to testing.   Past Medical History:  Diagnosis Date   Asthma     There are no problems to display for this patient.   History reviewed. No pertinent surgical history.  OB History   No obstetric history on file.      Home Medications    Prior to Admission medications   Medication Sig Start Date End Date Taking? Authorizing Provider  albuterol (VENTOLIN HFA) 108 (90 Base) MCG/ACT inhaler Inhale 1-2 puffs into the lungs every 6 (six) hours as needed for wheezing or shortness of breath. 11/22/20   Wallis Bamberg, PA-C  amoxicillin-clavulanate (AUGMENTIN) 875-125 MG tablet Take 1 tablet by mouth every 12 (twelve) hours. 10/23/20   Suresh Audi, Noberto Retort, PA-C  benzonatate (TESSALON) 100 MG capsule Take 1-2 capsules (100-200 mg total) by mouth 3 (three) times daily as needed. 11/22/20   Wallis Bamberg, PA-C  predniSONE (DELTASONE) 20 MG tablet Take 2 tablets daily with breakfast. 11/22/20   Wallis Bamberg, PA-C  promethazine-dextromethorphan (PROMETHAZINE-DM) 6.25-15 MG/5ML syrup Take 5 mLs by mouth at bedtime as needed for cough. 10/23/20   Linzy Laury, Noberto Retort, PA-C    Family History History reviewed. No pertinent family history.  Social History Social History   Tobacco Use   Smoking status: Never   Smokeless tobacco: Never  Vaping Use   Vaping Use: Never used  Substance Use Topics   Alcohol use: No   Drug use: No     Allergies   Patient has no known allergies.   Review of Systems Review of Systems  Constitutional:  Positive for activity change and appetite change. Negative for fatigue and fever.  Respiratory:  Negative for cough and shortness of breath.   Cardiovascular:  Negative for chest pain.  Gastrointestinal:  Positive for abdominal pain, diarrhea, nausea and vomiting. Negative for blood in stool.  Musculoskeletal:  Negative for arthralgias and myalgias.  Neurological:  Negative for dizziness, light-headedness, numbness and headaches.    Physical Exam Triage Vital Signs ED Triage Vitals  Enc Vitals Group     BP 03/12/21 1849 120/80     Pulse Rate 03/12/21 1849 87     Resp 03/12/21 1849 17  Temp 03/12/21 1849 99.5 F (37.5 C)     Temp Source 03/12/21 1849 Oral     SpO2 03/12/21 1849 97 %     Weight --      Height --      Head Circumference --      Peak Flow --      Pain Score 03/12/21 1848 8     Pain Loc --      Pain Edu? --      Excl. in GC? --    No data found.  Updated Vital Signs BP 120/80 (BP Location: Left Arm)   Pulse 87   Temp 99.5 F (37.5 C) (Oral)   Resp 17   SpO2 97%   Visual Acuity Right Eye Distance:   Left Eye Distance:   Bilateral Distance:    Right Eye Near:   Left Eye Near:     Bilateral Near:     Physical Exam Vitals reviewed.  Constitutional:      General: She is awake. She is not in acute distress.    Appearance: Normal appearance. She is well-developed. She is not ill-appearing.     Comments: Very pleasant female appears stated age no acute distress sitting comfortably in exam room  HENT:     Head: Normocephalic and atraumatic.     Mouth/Throat:     Mouth: Mucous membranes are moist.     Pharynx: Uvula midline. No oropharyngeal exudate or posterior oropharyngeal erythema.  Cardiovascular:     Rate and Rhythm: Normal rate and regular rhythm.     Heart sounds: Normal heart sounds, S1 normal and S2 normal. No murmur heard. Pulmonary:     Effort: Pulmonary effort is normal.     Breath sounds: Normal breath sounds. No wheezing, rhonchi or rales.     Comments: Clear to auscultation bilaterally Abdominal:     General: Bowel sounds are normal.     Palpations: Abdomen is soft.     Tenderness: There is abdominal tenderness in the right upper quadrant, epigastric area and left upper quadrant. There is no right CVA tenderness, left CVA tenderness, guarding or rebound. Negative signs include Murphy's sign.     Comments: Tenderness to palpation throughout abdomen but worse in epigastrium.  No evidence of acute abdomen on physical exam.  Psychiatric:        Behavior: Behavior is cooperative.     UC Treatments / Results  Labs (all labs ordered are listed, but only abnormal results are displayed) Labs Reviewed  POCT URINALYSIS DIPSTICK, ED / UC - Abnormal; Notable for the following components:      Result Value   Bilirubin Urine SMALL (*)    Ketones, ur 80 (*)    Protein, ur 30 (*)    Leukocytes,Ua SMALL (*)    All other components within normal limits  URINE CULTURE  CBC WITH DIFFERENTIAL/PLATELET  COMPREHENSIVE METABOLIC PANEL  LIPASE, BLOOD  POC URINE PREG, ED    EKG   Radiology No results found.  Procedures Procedures (including critical care  time)  Medications Ordered in UC Medications  ondansetron (ZOFRAN-ODT) disintegrating tablet 4 mg (4 mg Oral Given 03/12/21 1918)    Initial Impression / Assessment and Plan / UC Course  I have reviewed the triage vital signs and the nursing notes.  Pertinent labs & imaging results that were available during my care of the patient were reviewed by me and considered in my medical decision making (see chart for details).  Urine pregnancy test was negative in clinic today.  UA was abnormal but patient denies any urinary symptoms so we will defer treating urine and wait for urine culture results to ensure appropriate antibiotic selection.  Basic labs obtained today-results pending.  Patient was given Zofran without improvement of symptoms.  We discussed potential utility of waiting for labs to help direct treatment the patient reports that she does not currently have phone access we cannot contact her with these lab results to determine next steps.  She also reports that pain has gradually been worsening and she is unable to eat/drink normally so we discussed that given severity of symptoms will be best to go to the emergency room since we do not have imaging capabilities.  Patient is agreeable to this and will go directly to the emergency room following visit.  Vital signs are stable at the time of discharge and she was safe for private transport.  Final Clinical Impressions(s) / UC Diagnoses   Final diagnoses:  Epigastric abdominal pain  Abdominal cramping  Nausea and vomiting, intractability of vomiting not specified, unspecified vomiting type     Discharge Instructions      Given how tender you are on exam I think it is reasonable to go to the emergency room for further evaluation.     ED Prescriptions   None    PDMP not reviewed this encounter.   Jeani Hawking, PA-C 03/12/21 2002

## 2021-03-12 NOTE — ED Notes (Signed)
Some labs have already been drawn/resulted at [facility].

## 2021-03-12 NOTE — ED Notes (Signed)
Patient is being discharged from the Urgent Care and sent to the Emergency Department via pov . Per Dorann Ou, PA, patient is in need of higher level of care due to abdominal tenderness. Patient is aware and verbalizes understanding of plan of care.  Vitals:   03/12/21 1849  BP: 120/80  Pulse: 87  Resp: 17  Temp: 99.5 F (37.5 C)  SpO2: 97%

## 2021-03-12 NOTE — ED Triage Notes (Signed)
Pt with upper abdominal pain for about 4 days. Has also been vomiting. Diarrhea the first day, then subsided. Tried OTC meds without relief. Went to UC, sent here for further eval. Labs

## 2021-03-12 NOTE — Discharge Instructions (Addendum)
Given how tender you are on exam I think it is reasonable to go to the emergency room for further evaluation.

## 2021-03-13 ENCOUNTER — Emergency Department (HOSPITAL_COMMUNITY): Payer: Medicaid Other

## 2021-03-13 LAB — URINALYSIS, ROUTINE W REFLEX MICROSCOPIC
Glucose, UA: NEGATIVE mg/dL
Hgb urine dipstick: NEGATIVE
Ketones, ur: >80 mg/dL — AB
Leukocytes,Ua: NEGATIVE
Nitrite: NEGATIVE
Protein, ur: NEGATIVE mg/dL
Specific Gravity, Urine: 1.02 (ref 1.005–1.030)
pH: 6 (ref 5.0–8.0)

## 2021-03-13 MED ORDER — ONDANSETRON HCL 4 MG/2ML IJ SOLN
4.0000 mg | Freq: Once | INTRAMUSCULAR | Status: AC
  Administered 2021-03-13: 4 mg via INTRAVENOUS
  Filled 2021-03-13: qty 2

## 2021-03-13 MED ORDER — ONDANSETRON 8 MG PO TBDP: 8.0000 mg | ORAL_TABLET | Freq: Three times a day (TID) | ORAL | 0 refills | Status: DC | PRN

## 2021-03-13 MED ORDER — SODIUM CHLORIDE 0.9 % IV SOLN
12.5000 mg | Freq: Four times a day (QID) | INTRAVENOUS | Status: DC | PRN
  Administered 2021-03-13: 12.5 mg via INTRAVENOUS
  Filled 2021-03-13: qty 0.5
  Filled 2021-03-13: qty 12.5

## 2021-03-13 MED ORDER — LACTATED RINGERS IV BOLUS
1000.0000 mL | Freq: Once | INTRAVENOUS | Status: AC
  Administered 2021-03-13: 1000 mL via INTRAVENOUS

## 2021-03-13 MED ORDER — PANTOPRAZOLE SODIUM 20 MG PO TBEC: 20.0000 mg | DELAYED_RELEASE_TABLET | Freq: Every day | ORAL | 0 refills | Status: DC

## 2021-03-13 MED ORDER — SODIUM CHLORIDE 0.9 % IV BOLUS
1000.0000 mL | Freq: Once | INTRAVENOUS | Status: AC
  Administered 2021-03-13: 1000 mL via INTRAVENOUS

## 2021-03-13 MED ORDER — PANTOPRAZOLE 80MG IVPB - SIMPLE MED
80.0000 mg | Freq: Once | INTRAVENOUS | Status: AC
  Administered 2021-03-13: 80 mg via INTRAVENOUS
  Filled 2021-03-13: qty 80

## 2021-03-13 NOTE — ED Provider Notes (Signed)
[facility] EMERGENCY DEPARTMENT Provider Note   CSN: [id number] Arrival date & time: 03/12/21  2015     History Chief Complaint  Patient presents with   Abdominal Pain    Michelle Mcfarland is a 20 y.o. female.  HPI     Past Medical History:  Diagnosis Date   Asthma    20 yo female presents today from urgent care last night.  She has had nausea, vomiting, and diarrhea that began 4 days ago.  She began with nausea first.  She then began having more vomiting which has continued since with the last emesis in the ED waiting room.  She has had fever, no chills.  She did drink alcohol the day before this began.  Her fiance has had similar symptoms but not has severe.  The patient denies any similar symptoms in the past.  NO uti sxs, concern for pregnancy, abnormal vaginal d/c. Labs were obtained at urgent care and resulted here- negative pregnancy, total bili 1.4, potassium 2.9, wbc 13.7, platelets 435,000 Pregnancy negative.  There are no problems to display for this patient.   No past surgical history on file.   OB History   No obstetric history on file.     No family history on file.  Social History   Tobacco Use   Smoking status: Never   Smokeless tobacco: Never  Vaping Use   Vaping Use: Never used  Substance Use Topics   Alcohol use: No   Drug use: No    Home Medications Prior to Admission medications   Medication Sig Start Date End Date Taking? Authorizing Provider  albuterol (VENTOLIN HFA) 108 (90 Base) MCG/ACT inhaler Inhale 1-2 puffs into the lungs every 6 (six) hours as needed for wheezing or shortness of breath. 11/22/20   Wallis Bamberg, PA-C  amoxicillin-clavulanate (AUGMENTIN) 875-125 MG tablet Take 1 tablet by mouth every 12 (twelve) hours. 10/23/20   Raspet, Noberto Retort, PA-C  benzonatate (TESSALON) 100 MG capsule Take 1-2 capsules (100-200 mg total) by mouth 3 (three) times daily as needed. 11/22/20   Wallis Bamberg, PA-C  predniSONE (DELTASONE) 20 MG  tablet Take 2 tablets daily with breakfast. 11/22/20   Wallis Bamberg, PA-C  promethazine-dextromethorphan (PROMETHAZINE-DM) 6.25-15 MG/5ML syrup Take 5 mLs by mouth at bedtime as needed for cough. 10/23/20   Raspet, Noberto Retort, PA-C    Allergies    Patient has no known allergies.  Review of Systems   Review of Systems  Constitutional:  Positive for activity change, appetite change, fatigue and fever.  HENT: Negative.    Eyes: Negative.   Respiratory: Negative.    Cardiovascular: Negative.   Gastrointestinal:  Positive for abdominal pain, diarrhea, nausea and vomiting.  Endocrine: Negative.   Genitourinary: Negative.   Musculoskeletal: Negative.   Skin: Negative.   Allergic/Immunologic: Negative.   Neurological: Negative.   Hematological: Negative.   Psychiatric/Behavioral: Negative.    All other systems reviewed and are negative.  Physical Exam Updated Vital Signs BP 132/85 (BP Location: Left Arm)   Pulse 86   Temp 98.6 F (37 C)   Resp 17   LMP 02/26/2021   SpO2 99%   Physical Exam  ED Results / Procedures / Treatments   Labs (all labs ordered are listed, but only abnormal results are displayed) Labs Reviewed  URINALYSIS, ROUTINE W REFLEX MICROSCOPIC - Abnormal; Notable for the following components:      Result Value   Color, Urine AMBER (*)    Specific Gravity, Urine >  1.030 (*)    Hgb urine dipstick TRACE (*)    Bilirubin Urine MODERATE (*)    Ketones, ur 15 (*)    Protein, ur 30 (*)    Leukocytes,Ua SMALL (*)    All other components within normal limits  URINALYSIS, MICROSCOPIC (REFLEX) - Abnormal; Notable for the following components:   Bacteria, UA MANY (*)    All other components within normal limits  LIPASE, BLOOD    EKG None  Radiology No results found.  Procedures Procedures   Medications Ordered in ED Medications  sodium chloride 0.9 % bolus 1,000 mL (has no administration in time range)  promethazine (PHENERGAN) 12.5 mg in sodium chloride 0.9 % 50  mL IVPB (has no administration in time range)    ED Course  I have reviewed the triage vital signs and the nursing notes.  Pertinent labs & imaging results that were available during my care of the patient were reviewed by me and considered in my medical decision making (see chart for details).  Clinical Course as of 03/13/21 1328  Wed Mar 13, 2021  0737 Urine analysis from yesterday reviewed and grossly contaminated with greater than 50 squamous epithelial cells.  Plan repeat urine with in and out cath.  Patient has an ultrasound when I went to discuss this with her. [DR]  1247 Repeat urine without nitrites or leukocytes but does have moderate bili and greater than 80 ketones [DR]  1247 Gallbladder ultrasound shows no evidence of acute cholecystitis or stones noted [DR]    Clinical Course User Index [DR] Margarita Grizzle, MD   MDM Rules/Calculators/A&P                           20 yo female presents today with epigastric/ruq pain with associated nausea and vomiting.  DDX includes, but is not limited to:  Right Upper Quadrant  Biliary colic Cholangitis Cholecystitis Fitz-Hugh-Curtis Syndrome Hepatitis Hepatic abscess Hepatic congestion Herpes zoster Mesenteric ischemia Perforated duodenal ulcer Pneumonia (RLL) Pulmonary embolism Pyelonephritis/nephrolithiasis  Imaging and labs will be obtained for further evaluation.  Patient received iv fluids, antiemetics and pain medication. Patient feels improved.  Labs reviewed and interpreted. CBC Cmet Urinalysis Lipase Pregnancy test  Doubt Gallbladder disease including biliary colic, cholangitis, cholecystitis, hepatitis or other diseases of liver and gb based on history, labs, and imaging. Doubt Fitz-Hugh-Curtis Syndrome based on history, labs Doubt mesenteric ischemia based on labs, history, patient age Doubt lung etiology based on history, exam, labs and imaging Doubt pyelonephritis based on history, urinalysis, labs, and  imaging. 19 No definitive cause of patient's abdominal pain nausea and vomiting.  Consider gastritis due to alcohol use versus viral syndrome as partner has had some similar symptoms.  Patient received 2 L of fluid here in the ED.  Heart rate is down to 80.  Received antiemetics and has taken some clear liquids and has not had active vomiting here in the ED although she continues to be nauseated. Plan antiemetics, PPI, Actilite balanced fluids by mouth. Discussed return precautions and need for follow-up with patient and she voices understanding. Final Clinical Impression(s) / ED Diagnoses Final diagnoses:  Epigastric pain  Nausea and vomiting, intractability of vomiting not specified, unspecified vomiting type    Rx / DC Orders ED Discharge Orders     None        Margarita Grizzle, MD 03/13/21 1332

## 2021-03-13 NOTE — Discharge Instructions (Addendum)
Please use nausea medicine as prescribed Please drink Gatorade mixed half-and-half with water Return to the emergency department if you are unable to tolerate fluids or have worsening pain. Recheck with your primary care doctor in the next 1 to 2 weeks.

## 2021-03-13 NOTE — ED Notes (Signed)
Pt attempted to drink water, states increased nausea after a couple of sips

## 2021-03-14 LAB — URINE CULTURE

## 2021-04-08 ENCOUNTER — Other Ambulatory Visit: Payer: Self-pay

## 2021-04-08 ENCOUNTER — Ambulatory Visit (HOSPITAL_COMMUNITY)
Admission: RE | Admit: 2021-04-08 | Discharge: 2021-04-08 | Disposition: A | Discharge status: Home / Self Care | Payer: Medicaid Other | Source: Ambulatory Visit | Attending: Emergency Medicine | Admitting: Emergency Medicine

## 2021-04-08 ENCOUNTER — Encounter (HOSPITAL_COMMUNITY): Payer: Self-pay

## 2021-04-08 VITALS — BP 105/72 | HR 100 | Temp 99.1°F | Resp 17

## 2021-04-08 DIAGNOSIS — R1013 Epigastric pain: Secondary | ICD-10-CM | POA: Diagnosis not present

## 2021-04-08 MED ORDER — PANTOPRAZOLE SODIUM 20 MG PO TBEC: 20.0000 mg | DELAYED_RELEASE_TABLET | Freq: Every day | ORAL | 1 refills | Status: DC

## 2021-04-08 NOTE — Discharge Instructions (Addendum)
Make an appointment with the gastroenterology clinic as soon as possible. Keep eating bland foods and keep taking pantoprazole daily.

## 2021-04-08 NOTE — ED Triage Notes (Signed)
Pt presents with stomach pain x 1 month. States she has been taking medication prescribed that was not helped.

## 2021-04-08 NOTE — ED Provider Notes (Signed)
[facility]    CSN: [id number] Arrival date & time: 04/08/21  7510      History   Chief Complaint Chief Complaint  Patient presents with  . Abdominal Pain    Stomach pain     HPI Michelle Mcfarland is a 20 y.o. female.  She reports almost a month of epigastric abdominal pain that is fairly constant but sometimes waxes and wanes.  She was seen in the ED 03/12/2021 for the same problem.  Right upper quadrant ultrasound normal.  At that time she was also having vomiting.  She is no longer having vomiting.  She is taking pantoprazole daily but does not feel like it helps.  She continues to eat a bland diet such as plain noodles and tolerates that better than a regular diet.  Denies fever.  Reports intermittent loose stools.   Abdominal Pain  Past Medical History:  Diagnosis Date  . Asthma     There are no problems to display for this patient.   History reviewed. No pertinent surgical history.  OB History   No obstetric history on file.      Home Medications    Prior to Admission medications   Medication Sig Start Date End Date Taking? Authorizing Provider  albuterol (VENTOLIN HFA) 108 (90 Base) MCG/ACT inhaler Inhale 1-2 puffs into the lungs every 6 (six) hours as needed for wheezing or shortness of breath. 11/22/20   Wallis Bamberg, PA-C  amoxicillin-clavulanate (AUGMENTIN) 875-125 MG tablet Take 1 tablet by mouth every 12 (twelve) hours. 10/23/20   Raspet, Noberto Retort, PA-C  benzonatate (TESSALON) 100 MG capsule Take 1-2 capsules (100-200 mg total) by mouth 3 (three) times daily as needed. 11/22/20   Wallis Bamberg, PA-C  ondansetron (ZOFRAN ODT) 8 MG disintegrating tablet Take 1 tablet (8 mg total) by mouth every 8 (eight) hours as needed for nausea or vomiting. 03/13/21   Margarita Grizzle, MD  pantoprazole (PROTONIX) 20 MG tablet Take 1 tablet (20 mg total) by mouth daily. 04/08/21   Cathlyn Parsons, NP  predniSONE (DELTASONE) 20 MG tablet Take 2 tablets daily with breakfast.  11/22/20   Wallis Bamberg, PA-C  promethazine-dextromethorphan (PROMETHAZINE-DM) 6.25-15 MG/5ML syrup Take 5 mLs by mouth at bedtime as needed for cough. 10/23/20   Raspet, Noberto Retort, PA-C    Family History History reviewed. No pertinent family history.  Social History Social History   Tobacco Use  . Smoking status: Never  . Smokeless tobacco: Never  Vaping Use  . Vaping Use: Never used  Substance Use Topics  . Alcohol use: No  . Drug use: No     Allergies   Patient has no known allergies.   Review of Systems Review of Systems  Gastrointestinal:  Positive for abdominal pain.    Physical Exam Triage Vital Signs ED Triage Vitals  Enc Vitals Group     BP 04/08/21 1011 105/72     Pulse Rate 04/08/21 1011 100     Resp 04/08/21 1011 17     Temp 04/08/21 1011 99.1 F (37.3 C)     Temp Source 04/08/21 1011 Oral     SpO2 04/08/21 1011 97 %     Weight --      Height --      Head Circumference --      Peak Flow --      Pain Score 04/08/21 1010 3     Pain Loc --      Pain Edu? --  Excl. in GC? --    No data found.  Updated Vital Signs BP 105/72 (BP Location: Right Arm)   Pulse 100   Temp 99.1 F (37.3 C) (Oral)   Resp 17   LMP 03/25/2021 (Approximate)   SpO2 97%   Visual Acuity Right Eye Distance:   Left Eye Distance:   Bilateral Distance:    Right Eye Near:   Left Eye Near:    Bilateral Near:     Physical Exam Constitutional:      General: She is not in acute distress.    Appearance: She is well-developed. She is obese. She is not ill-appearing.  Cardiovascular:     Rate and Rhythm: Normal rate and regular rhythm.  Pulmonary:     Effort: Pulmonary effort is normal.     Breath sounds: Normal breath sounds.  Abdominal:     General: Abdomen is flat. Bowel sounds are normal. There is no distension or abdominal bruit.     Palpations: Abdomen is soft.     Tenderness: There is abdominal tenderness in the epigastric area. There is no guarding or rebound.   Neurological:     Mental Status: She is alert.     UC Treatments / Results  Labs (all labs ordered are listed, but only abnormal results are displayed) Labs Reviewed - No data to display  EKG   Radiology No results found.  Procedures Procedures (including critical care time)  Medications Ordered in UC Medications - No data to display  Initial Impression / Assessment and Plan / UC Course  I have reviewed the triage vital signs and the nursing notes.  Pertinent labs & imaging results that were available during my care of the patient were reviewed by me and considered in my medical decision making (see chart for details).  Patient with almost a 1 month duration of epigastric abdominal pain not relieved by pantoprazole.  I refilled her pantoprazole as she is out and I have referred her to GI.   Final Clinical Impressions(s) / UC Diagnoses   Final diagnoses:  Abdominal pain, epigastric     Discharge Instructions      Make an appointment with the gastroenterology clinic as soon as possible. Keep eating bland foods and keep taking pantoprazole daily.    ED Prescriptions     Medication Sig Dispense Auth. Provider   pantoprazole (PROTONIX) 20 MG tablet Take 1 tablet (20 mg total) by mouth daily. 30 tablet Cathlyn Parsons, NP      PDMP not reviewed this encounter.   Cathlyn Parsons, NP 04/08/21 1037

## 2021-04-10 ENCOUNTER — Ambulatory Visit (INDEPENDENT_AMBULATORY_CARE_PROVIDER_SITE_OTHER): Payer: Medicaid Other | Admitting: Internal Medicine

## 2021-04-10 ENCOUNTER — Other Ambulatory Visit (INDEPENDENT_AMBULATORY_CARE_PROVIDER_SITE_OTHER): Payer: Medicaid Other

## 2021-04-10 ENCOUNTER — Encounter: Payer: Self-pay | Admitting: Internal Medicine

## 2021-04-10 VITALS — BP 120/70 | HR 82 | Ht 64.0 in | Wt 174.8 lb

## 2021-04-10 DIAGNOSIS — R1013 Epigastric pain: Secondary | ICD-10-CM

## 2021-04-10 DIAGNOSIS — R195 Other fecal abnormalities: Secondary | ICD-10-CM | POA: Diagnosis not present

## 2021-04-10 LAB — COMPREHENSIVE METABOLIC PANEL
ALT: 11 U/L (ref 0–35)
AST: 14 U/L (ref 0–37)
Albumin: 4.3 g/dL (ref 3.5–5.2)
Alkaline Phosphatase: 75 U/L (ref 39–117)
BUN: 13 mg/dL (ref 6–23)
CO2: 23 mEq/L (ref 19–32)
Calcium: 9.4 mg/dL (ref 8.4–10.5)
Chloride: 106 mEq/L (ref 96–112)
Creatinine, Ser: 0.75 mg/dL (ref 0.40–1.20)
GFR: 114.92 mL/min (ref >60.00)
Glucose, Bld: 80 mg/dL (ref 70–99)
Potassium: 3.8 mEq/L (ref 3.5–5.1)
Sodium: 139 mEq/L (ref 135–145)
Total Bilirubin: 0.4 mg/dL (ref 0.2–1.2)
Total Protein: 7.7 g/dL (ref 6.0–8.3)

## 2021-04-10 LAB — TSH: TSH: 1.44 u[IU]/mL (ref 0.35–5.50)

## 2021-04-10 MED ORDER — SUCRALFATE 1 G PO TABS: 1.0000 g | ORAL_TABLET | Freq: Three times a day (TID) | ORAL | 0 refills | Status: DC

## 2021-04-10 NOTE — Progress Notes (Signed)
Michelle Mcfarland 20 y.o. [birth year] [id number]  Assessment & Plan:   Encounter Diagnoses  Name Primary?   Abdominal pain, epigastric Yes   Loose stools    Hypercalcemia     She will stop her pantoprazole. Sucralfate 1 g 4 times daily #120 no refill prescribed H. pylori test of the stool in 2 weeks Check TSH  Recheck CMET also - will add on as elevated Ca and bili noticed after patient left    Subjective:   Chief Complaint: Epigastric pain  HPI 20 year old African-American woman here with her 2 partners because of epigastric pain.  She has been in the urgent care several times over the past couple of months with epigastric pain.  I have reviewed those records and labs and imaging.  Started off with some nausea vomiting and diarrhea and she has had persistent epigastric pain that comes and goes not necessarily related to meals described as sharp and sometimes penetrating into the back.  A little bit of nausea at this point no vomiting now.  She has an ultrasound of the right upper quadrant without stones or other abnormalities to explain this.  She is never had problems like this before until she was ill earlier this year. At this point stools are loose but not so much of a major problem but this epigastric pain and tenderness persist and are bothersome.  No history of NSAID use or other medications that might trigger this that I can tell.  Pantoprazole has not helped at all.  He cannot tell me if the pantoprazole use coincided with the loose stools.  She is using his ondansetron as needed for nausea.  No fevers reported though she often feels hot.  Lab review shows that she did have a UTI in September her lipase has been normal on multiple occasions as had her LFTs.  She had a slightly elevated bilirubin on 1 occasion in September and total protein slightly elevated at eighteen 8.2 and a potassium of 2.9.  Calcium was elevated at 10.7 in September.  No Known Allergies Current  Meds  Medication Sig   albuterol (VENTOLIN HFA) 108 (90 Base) MCG/ACT inhaler Inhale 1-2 puffs into the lungs every 6 (six) hours as needed for wheezing or shortness of breath.   ondansetron (ZOFRAN ODT) 8 MG disintegrating tablet Take 1 tablet (8 mg total) by mouth every 8 (eight) hours as needed for nausea or vomiting.   sucralfate (CARAFATE) 1 g tablet Take 1 tablet (1 g total) by mouth 4 (four) times daily -  with meals and at bedtime.   [DISCONTINUED] pantoprazole (PROTONIX) 20 MG tablet Take 1 tablet (20 mg total) by mouth daily.   Past Medical History:  Diagnosis Date   Asthma    History reviewed. No pertinent surgical history. Social History   Tobacco Use   Smoking status: Never   Smokeless tobacco: Never  Vaping Use   Vaping Use: Never used  Substance Use Topics   Alcohol use: No   Drug use: No     family history includes Breast cancer in her mother; Diabetes in her father.   Review of Systems As per HPI  Objective:   Physical Exam BP 120/70   Pulse 82   Ht 5\' 4"  (1.626 m)   Wt 174 lb 12.8 oz (79.3 kg)   LMP 03/25/2021 (Approximate)   BMI 30.00 kg/m  Obese young bw NAD Lungs cta Cor NL S1S2 no rmg Abd soft, mildly tender epigastrium no HSM or mass  and BS + Alert and oriented x 3

## 2021-04-10 NOTE — Patient Instructions (Signed)
Your provider has requested that you go to the basement level for lab work before leaving today. Press "B" on the elevator. The lab is located at the first door on the left as you exit the elevator.   Stop your pantoprazole and then in 2 weeks do the stool test for H. Pylori please.   We have sent the following medications to your pharmacy for you to pick up at your convenience: Carafate  Due to recent changes in healthcare laws, you may see the results of your imaging and laboratory studies on MyChart before your provider has had a chance to review them.  We understand that in some cases there may be results that are confusing or concerning to you. Not all laboratory results come back in the same time frame and the provider may be waiting for multiple results in order to interpret others.  Please give [place] 48 hours in order for your provider to thoroughly review all the results before contacting the office for clarification of your results.   I appreciate the opportunity to care for you. Stan Head, MD, [facility]

## 2021-04-12 ENCOUNTER — Telehealth: Payer: Self-pay

## 2021-04-12 NOTE — Telephone Encounter (Signed)
Pt stated that her throat  felt sore and she had redness in the back of her  throat this morning. Pt stated that when she coughed this morning that there was a small amount of blood. Pt was notified that this does not seem like a GI problem and encouraged her to reach out to her PCP. Pt verbalized understanding with all questions answered.

## 2021-04-12 NOTE — Telephone Encounter (Signed)
Tried to contact pt to discuss symptoms that pt is experiencing. Spoke with pt mother Engineer, building services) and was informed that pt phone is off today and pt is at work for half the day. Crystal stated that she will relay the message and have the pt call after she gets off work.

## 2021-04-28 ENCOUNTER — Emergency Department (HOSPITAL_COMMUNITY)
Admission: EM | Admit: 2021-04-28 | Discharge: 2021-04-28 | Discharge status: Against Medical Advice | Payer: Medicaid Other | Attending: Emergency Medicine | Admitting: Emergency Medicine

## 2021-04-28 DIAGNOSIS — J45909 Unspecified asthma, uncomplicated: Secondary | ICD-10-CM | POA: Insufficient documentation

## 2021-04-28 DIAGNOSIS — K649 Unspecified hemorrhoids: Secondary | ICD-10-CM | POA: Insufficient documentation

## 2021-04-28 DIAGNOSIS — N9489 Other specified conditions associated with female genital organs and menstrual cycle: Secondary | ICD-10-CM | POA: Insufficient documentation

## 2021-04-28 DIAGNOSIS — R109 Unspecified abdominal pain: Secondary | ICD-10-CM | POA: Insufficient documentation

## 2021-04-28 DIAGNOSIS — R112 Nausea with vomiting, unspecified: Secondary | ICD-10-CM | POA: Diagnosis not present

## 2021-04-28 DIAGNOSIS — K6289 Other specified diseases of anus and rectum: Secondary | ICD-10-CM | POA: Diagnosis present

## 2021-04-28 LAB — COMPREHENSIVE METABOLIC PANEL
ALT: 17 U/L (ref 0–44)
AST: 21 U/L (ref 15–41)
Albumin: 4.7 g/dL (ref 3.5–5.0)
Alkaline Phosphatase: 82 U/L (ref 38–126)
Anion gap: 10 (ref 5–15)
BUN: 12 mg/dL (ref 6–20)
CO2: 22 mmol/L (ref 22–32)
Calcium: 9.8 mg/dL (ref 8.9–10.3)
Chloride: 104 mmol/L (ref 98–111)
Creatinine, Ser: 0.57 mg/dL (ref 0.44–1.00)
GFR, Estimated: >60 mL/min (ref >60)
Glucose, Bld: 101 mg/dL — ABNORMAL HIGH (ref 70–99)
Potassium: 3.2 mmol/L — ABNORMAL LOW (ref 3.5–5.1)
Sodium: 136 mmol/L (ref 135–145)
Total Bilirubin: 0.7 mg/dL (ref 0.3–1.2)
Total Protein: 8.7 g/dL — ABNORMAL HIGH (ref 6.5–8.1)

## 2021-04-28 LAB — CBC
HCT: 35.1 % — ABNORMAL LOW (ref 36.0–46.0)
Hemoglobin: 10.4 g/dL — ABNORMAL LOW (ref 12.0–15.0)
MCH: 19 pg — ABNORMAL LOW (ref 26.0–34.0)
MCHC: 29.6 g/dL — ABNORMAL LOW (ref 30.0–36.0)
MCV: 64.3 fL — ABNORMAL LOW (ref 80.0–100.0)
Platelets: 468 10*3/uL — ABNORMAL HIGH (ref 150–400)
RBC: 5.46 MIL/uL — ABNORMAL HIGH (ref 3.87–5.11)
RDW: 19.5 % — ABNORMAL HIGH (ref 11.5–15.5)
WBC: 7.3 10*3/uL (ref 4.0–10.5)
nRBC: 0 % (ref 0.0–0.2)

## 2021-04-28 LAB — URINALYSIS, ROUTINE W REFLEX MICROSCOPIC
Bacteria, UA: NONE SEEN
Bilirubin Urine: NEGATIVE
Glucose, UA: NEGATIVE mg/dL
Ketones, ur: 80 mg/dL — AB
Nitrite: NEGATIVE
Protein, ur: 30 mg/dL — AB
Specific Gravity, Urine: 1.026 (ref 1.005–1.030)
pH: 8 (ref 5.0–8.0)

## 2021-04-28 LAB — I-STAT BETA HCG BLOOD, ED (MC, WL, AP ONLY): I-stat hCG, quantitative: <5 m[IU]/mL (ref <5)

## 2021-04-28 LAB — LIPASE, BLOOD: Lipase: 24 U/L (ref 11–51)

## 2021-04-28 MED ORDER — LACTATED RINGERS IV BOLUS
1000.0000 mL | Freq: Once | INTRAVENOUS | Status: AC
  Administered 2021-04-28: 1000 mL via INTRAVENOUS

## 2021-04-28 MED ORDER — HYDROCORTISONE (PERIANAL) 2.5 % EX CREA: 1.0000 "application " | TOPICAL_CREAM | Freq: Two times a day (BID) | CUTANEOUS | 0 refills | Status: DC

## 2021-04-28 MED ORDER — PROMETHAZINE HCL 25 MG RE SUPP: 25.0000 mg | Freq: Four times a day (QID) | RECTAL | 0 refills | Status: DC | PRN

## 2021-04-28 MED ORDER — HALOPERIDOL LACTATE 5 MG/ML IJ SOLN
5.0000 mg | Freq: Once | INTRAMUSCULAR | Status: AC
  Administered 2021-04-28: 5 mg via INTRAVENOUS
  Filled 2021-04-28: qty 1

## 2021-04-28 MED ORDER — DOCUSATE SODIUM 100 MG PO CAPS: 100.0000 mg | ORAL_CAPSULE | Freq: Two times a day (BID) | ORAL | 0 refills | Status: DC

## 2021-04-28 MED ORDER — METOCLOPRAMIDE HCL 5 MG/ML IJ SOLN
10.0000 mg | Freq: Once | INTRAMUSCULAR | Status: AC
  Administered 2021-04-28: 10 mg via INTRAVENOUS
  Filled 2021-04-28: qty 2

## 2021-04-28 MED ORDER — ONDANSETRON 8 MG PO TBDP: 8.0000 mg | ORAL_TABLET | Freq: Three times a day (TID) | ORAL | 0 refills | Status: DC | PRN

## 2021-04-28 NOTE — ED Triage Notes (Signed)
Patient reports vomiting yellow bile x3 days, reports hemorrhoid pain 10/10.

## 2021-04-28 NOTE — ED Notes (Signed)
Pt NAD, laying in bed with significant other. Pt a.ox4, c/o 7/10 epigastric ABD pain, n/v/d x 3 days. ABD soft, tender to epigastiric. Denies GU symptoms, fever.

## 2021-04-28 NOTE — ED Notes (Signed)
Pt states she wants to leave AMA. Provider made aware

## 2021-04-28 NOTE — ED Notes (Signed)
Per NT, patient wants to leave. MD aware and speaking with patient. Patient signed AMA. IV removed.

## 2021-04-28 NOTE — ED Provider Notes (Signed)
[facility] DEPT Provider Note   CSN: [id number] Arrival date & time: 04/28/21  0920     History Chief Complaint  Patient presents with   Emesis   Hemorrhoids    Michelle Mcfarland is a 20 y.o. female.  HPI    20 year old female comes in with chief complaint of abdominal pain, nausea and vomiting.  She is also reporting possible hemorrhoid causing rectal pain.  Patient's primary complaint is the hemorrhoid.  She reports worsening rectal pain over the past few days.  In the past she was told that she has hemorrhoids.  She has off-and-on constipation.  No history of IBD or IBS in the family or for the patient.  Patient also reports that she is having nausea and vomiting.  Abdominal discomfort is generalized.  Symptoms have been present for the last several weeks.  She had about 3 episodes of emesis today and 6 yesterday.  Vomit is watery and sometimes yellow.  No blood.  No bloody stools.  Past Medical History:  Diagnosis Date   Asthma     There are no problems to display for this patient.   No past surgical history on file.   OB History   No obstetric history on file.     Family History  Problem Relation Age of Onset   Breast cancer Mother    Diabetes Father     Social History   Tobacco Use   Smoking status: Never   Smokeless tobacco: Never  Vaping Use   Vaping Use: Never used  Substance Use Topics   Alcohol use: No   Drug use: No    Home Medications Prior to Admission medications   Medication Sig Start Date End Date Taking? Authorizing Provider  docusate sodium (COLACE) 100 MG capsule Take 1 capsule (100 mg total) by mouth every 12 (twelve) hours. 04/28/21  Yes Kaidance Pantoja, Thelma Comp, MD  hydrocortisone (ANUSOL-HC) 2.5 % rectal cream Place 1 application rectally 2 (two) times daily. 04/28/21  Yes Varney Biles, MD  promethazine (PHENERGAN) 25 MG suppository Place 1 suppository (25 mg total) rectally every 6 (six) hours as needed for  nausea or vomiting. 04/28/21  Yes Varney Biles, MD  albuterol (VENTOLIN HFA) 108 (90 Base) MCG/ACT inhaler Inhale 1-2 puffs into the lungs every 6 (six) hours as needed for wheezing or shortness of breath. 11/22/20   Jaynee Eagles, PA-C  ondansetron (ZOFRAN ODT) 8 MG disintegrating tablet Take 1 tablet (8 mg total) by mouth every 8 (eight) hours as needed for nausea or vomiting. 04/28/21   Varney Biles, MD  sucralfate (CARAFATE) 1 g tablet Take 1 tablet (1 g total) by mouth 4 (four) times daily -  with meals and at bedtime. 04/10/21   Gatha Mayer, MD    Allergies    Patient has no known allergies.  Review of Systems   Review of Systems  Constitutional:  Positive for activity change.  Gastrointestinal:  Positive for nausea and vomiting. Negative for anal bleeding.  Allergic/Immunologic: Negative for immunocompromised state.  All other systems reviewed and are negative.  Physical Exam Updated Vital Signs BP 124/71 (BP Location: Left Arm)   Pulse 83   Temp 99.3 F (37.4 C) (Oral)   Resp 18   Ht 5\' 4"  (1.626 m)   Wt 80 kg   LMP 04/23/2021   SpO2 100%   BMI 30.27 kg/m   Physical Exam Vitals and nursing note reviewed.  Constitutional:      Appearance: She is well-developed.  HENT:     Head: Atraumatic.     Mouth/Throat:     Mouth: Mucous membranes are dry.  Cardiovascular:     Rate and Rhythm: Normal rate.  Pulmonary:     Effort: Pulmonary effort is normal.  Abdominal:     General: There is no distension.     Tenderness: There is abdominal tenderness. There is no guarding or rebound.     Comments: Patient has a rectal hemorrhoid without thrombosis or infection evidence  Musculoskeletal:     Cervical back: Normal range of motion and neck supple.  Skin:    General: Skin is warm and dry.  Neurological:     Mental Status: She is alert and oriented to person, place, and time.    ED Results / Procedures / Treatments   Labs (all labs ordered are listed, but only  abnormal results are displayed) Labs Reviewed  COMPREHENSIVE METABOLIC PANEL - Abnormal; Notable for the following components:      Result Value   Potassium 3.2 (*)    Glucose, Bld 101 (*)    Total Protein 8.7 (*)    All other components within normal limits  CBC - Abnormal; Notable for the following components:   RBC 5.46 (*)    Hemoglobin 10.4 (*)    HCT 35.1 (*)    MCV 64.3 (*)    MCH 19.0 (*)    MCHC 29.6 (*)    RDW 19.5 (*)    Platelets 468 (*)    All other components within normal limits  URINALYSIS, ROUTINE W REFLEX MICROSCOPIC - Abnormal; Notable for the following components:   APPearance HAZY (*)    Hgb urine dipstick MODERATE (*)    Ketones, ur 80 (*)    Protein, ur 30 (*)    Leukocytes,Ua MODERATE (*)    All other components within normal limits  LIPASE, BLOOD  I-STAT BETA HCG BLOOD, ED ([facility], [facility], [facility] ONLY)    EKG None  Radiology No results found.  Procedures Procedures   Medications Ordered in ED Medications  lactated ringers bolus 1,000 mL (1,000 mLs Intravenous New Bag/Given 04/28/21 1558)  haloperidol lactate (HALDOL) injection 5 mg (5 mg Intravenous Given 04/28/21 1556)  metoCLOPramide (REGLAN) injection 10 mg (10 mg Intravenous Given 04/28/21 1559)    ED Course  I have reviewed the triage vital signs and the nursing notes.  Pertinent labs & imaging results that were available during my care of the patient were reviewed by me and considered in my medical decision making (see chart for details).  Clinical Course as of 04/28/21 1642  Sun Apr 28, 2021  1641 Nursing staff reported that patient wants to leave AMA.  I just assessed the patient 10 minutes ago.  At that time she was getting fluid resuscitation and had just received Haldol.  Our plan was to ensure that she passes oral challenge and gets hydration.  Patient states that she feels hot and cold and feels like if she states she will have a mental breakdown.  Since she was just given Haldol, informed  them that I can try and see if Benadryl can help her symptoms in case this is EPS related to her medication.  Patient prefers [place] going home.  She understands that her symptoms might get worse after being discharged.  She has requested that we send her prescriptions to pharmacy electronically, which we will do so.  Her fluid bolus is almost completed.  Strict ER return precautions have been discussed,  and patient is agreeing with the plan and is comfortable with the workup done and the recommendations from the ER.  [AN]    Clinical Course User Index [AN] Derwood Kaplan, MD   MDM Rules/Calculators/A&P                           20 year old female comes in with chief complaint of nausea, vomiting, abdominal pain and rectal pain.  Her primary complaint was rectal pain.  She does have a nonthrombosed, not infected hemorrhoid.  Sitz bath as a treatment discussed.  Additionally patient is reporting nausea and vomiting and she is noted to have ketonuria.  Fluid resuscitation ordered.  Haldol given.  Patient admits to marijuana use no other drug use.  She does not have diabetes or any known history of gastroparesis.  Symptoms of nausea and vomiting are relatively new, starting about a month or 2 ago.  Will reassess -p.o. challenge started.  Final Clinical Impression(s) / ED Diagnoses Final diagnoses:  Hemorrhoids, unspecified hemorrhoid type  Nausea and vomiting, unspecified vomiting type    Rx / DC Orders ED Discharge Orders          Ordered    docusate sodium (COLACE) 100 MG capsule  Every 12 hours        04/28/21 1640    hydrocortisone (ANUSOL-HC) 2.5 % rectal cream  2 times daily        04/28/21 1640    ondansetron (ZOFRAN ODT) 8 MG disintegrating tablet  Every 8 hours PRN        04/28/21 1641    promethazine (PHENERGAN) 25 MG suppository  Every 6 hours PRN        04/28/21 1641             Derwood Kaplan, MD 04/28/21 1647

## 2021-05-06 ENCOUNTER — Other Ambulatory Visit: Payer: Medicaid Other

## 2021-05-06 DIAGNOSIS — R195 Other fecal abnormalities: Secondary | ICD-10-CM

## 2021-05-06 DIAGNOSIS — R1013 Epigastric pain: Secondary | ICD-10-CM

## 2021-05-08 LAB — H. PYLORI ANTIGEN, STOOL: H pylori Ag, Stl: NEGATIVE

## 2021-05-14 ENCOUNTER — Ambulatory Visit
Admission: EM | Admit: 2021-05-14 | Discharge: 2021-05-14 | Disposition: A | Discharge status: Home / Self Care | Payer: Medicaid Other | Attending: Emergency Medicine | Admitting: Emergency Medicine

## 2021-05-14 DIAGNOSIS — R110 Nausea: Secondary | ICD-10-CM

## 2021-05-14 DIAGNOSIS — R1013 Epigastric pain: Secondary | ICD-10-CM

## 2021-05-14 MED ORDER — ONDANSETRON 8 MG PO TBDP: 8.0000 mg | ORAL_TABLET | Freq: Three times a day (TID) | ORAL | 0 refills | Status: DC | PRN

## 2021-05-14 NOTE — ED Triage Notes (Signed)
Patient reports having n/v/d , abd cramping.

## 2021-05-14 NOTE — ED Triage Notes (Signed)
Attempt to call from lobby. 

## 2021-05-14 NOTE — Discharge Instructions (Signed)
You will need to follow up with gi since these are similar sx as you have had in the past  We do not have the resources to do the testing here  Take nausea meds as needed

## 2021-05-29 ENCOUNTER — Encounter: Payer: Self-pay | Admitting: Physician Assistant

## 2021-05-29 ENCOUNTER — Ambulatory Visit (INDEPENDENT_AMBULATORY_CARE_PROVIDER_SITE_OTHER): Payer: Medicaid Other | Admitting: Physician Assistant

## 2021-05-29 VITALS — BP 110/62 | HR 95 | Ht 64.0 in | Wt 171.4 lb

## 2021-05-29 DIAGNOSIS — D509 Iron deficiency anemia, unspecified: Secondary | ICD-10-CM

## 2021-05-29 DIAGNOSIS — R1013 Epigastric pain: Secondary | ICD-10-CM | POA: Diagnosis not present

## 2021-05-29 DIAGNOSIS — R112 Nausea with vomiting, unspecified: Secondary | ICD-10-CM | POA: Diagnosis not present

## 2021-05-29 MED ORDER — ONDANSETRON 4 MG PO TBDP: 4.0000 mg | ORAL_TABLET | Freq: Four times a day (QID) | ORAL | 2 refills | Status: DC | PRN

## 2021-05-29 NOTE — Patient Instructions (Signed)
If you are age 20 or older, your body mass index should be between 23-30. Your Body mass index is 29.42 kg/m. If this is out of the aforementioned range listed, please consider follow up with your Primary Care Provider.  If you are age 26 or younger, your body mass index should be between 19-25. Your Body mass index is 29.42 kg/m. If this is out of the aformentioned range listed, please consider follow up with your Primary Care Provider.   ________________________________________________________  The [facility] providers would like to encourage you to use [facility] to communicate with providers for non-urgent requests or questions.  Due to long hold times on the telephone, sending your provider a message by [facility] may be a faster and more efficient way to get a response.  Please allow 48 business hours for a response.  Please remember that this is for non-urgent requests.  _______________________________________________________  Your provider has requested that you go to the basement level for lab work before leaving today. Press "B" on the elevator. The lab is located at the first door on the left as you exit the elevator.  You have been scheduled for an endoscopy. Please follow written instructions given to you at your visit today. If you use inhalers (even only as needed), please bring them with you on the day of your procedure.  START Ondansetron 4mg  1 tablet every 6 hours as needed.. Try taking before bed.  Thank you for entrusting me with your care and choosing [facility].  Amy Esterwood, PA-C

## 2021-05-29 NOTE — Progress Notes (Signed)
Subjective:    Patient ID: Michelle Mcfarland, female    DOB: 09-07-2000, 20 y.o.   MRN: [id number]  HPI Michelle Mcfarland is a 20 year old African-American female, established with Dr. Carlean Purl, who had been seen here initially on 04/13/2021 by Dr. Carlean Purl with complaints of epigastric pain and intermittent loose stools.  She comes in today for follow-up.  At the time of initial office visit her PPI was stopped as this was not offering any benefit.  She was started on a course of Carafate 4 times daily and H. pylori stool antigen was ordered. H. pylori stool antigen has returned negative.  Patient says she stopped the Carafate because this seemed to hurt her stomach more.  She did undergo upper abdominal ultrasound in September 2022 which was normal. Most recent labs on 04/28/2021 with urgent care/ER visit show potassium 3.2 LFTs within normal limits WBC 7.3/hemoglobin 10.4/hematocrit 35/MCV of 64.  She has had several ER visits this fall. No prior CT imaging. On further questioning today.  Patient says that she has been told that she is anemic in the past.  She does have heavy menses, she has not been on iron therapy. She says her GI symptoms have been present over the past couple of months and prior to that time she had been feeling well.  She is not feeling any worse than when she was seen here a month ago but has not had any improvement in symptoms.  She continues to complain of constant epigastric pain which she describes as burning and sharp in nature she does not have any radiation to her back or her chest but has some radiation across the abdomen at times.  She is having frequent episodes of nausea and vomiting, usually feels better for short period of time after vomiting.  She continues to have frequent early morning vomiting of primarily liquid material. She has been able to eat but weight is down about 3 pounds over the past 6 weeks she says she feels worse if she tries to eat fast or a larger amount.   Denies any aspirin or NSAID use, denies any regular EtOH use.  She does endorse using cannabis on a daily basis. She has a prescription for Zofran ODT which has been helpful. Is not complaining of diarrhea, no melena or hematochezia.  Review of Systems.Pertinent positive and negative review of systems were noted in the above HPI section.  All other review of systems was otherwise negative.   Outpatient Encounter Medications as of 05/29/2021  Medication Sig   albuterol (VENTOLIN HFA) 108 (90 Base) MCG/ACT inhaler Inhale 1-2 puffs into the lungs every 6 (six) hours as needed for wheezing or shortness of breath.   [DISCONTINUED] ondansetron (ZOFRAN ODT) 8 MG disintegrating tablet Take 1 tablet (8 mg total) by mouth every 8 (eight) hours as needed for nausea or vomiting.   ondansetron (ZOFRAN-ODT) 4 MG disintegrating tablet Take 1 tablet (4 mg total) by mouth every 6 (six) hours as needed for nausea or vomiting.   [DISCONTINUED] docusate sodium (COLACE) 100 MG capsule Take 1 capsule (100 mg total) by mouth every 12 (twelve) hours.   [DISCONTINUED] hydrocortisone (ANUSOL-HC) 2.5 % rectal cream Place 1 application rectally 2 (two) times daily.   [DISCONTINUED] promethazine (PHENERGAN) 25 MG suppository Place 1 suppository (25 mg total) rectally every 6 (six) hours as needed for nausea or vomiting.   [DISCONTINUED] sucralfate (CARAFATE) 1 g tablet Take 1 tablet (1 g total) by mouth 4 (four) times daily -  with meals and at bedtime.   No facility-administered encounter medications on file as of 05/29/2021.   No Known Allergies There are no problems to display for this patient.  Social History   Socioeconomic History   Marital status: Single    Spouse name: Not on file   Number of children: Not on file   Years of education: Not on file   Highest education level: Not on file  Occupational History   Not on file  Tobacco Use   Smoking status: Never   Smokeless tobacco: Never  Vaping Use   Vaping  Use: Never used  Substance and Sexual Activity   Alcohol use: No   Drug use: No   Sexual activity: Yes  Other Topics Concern   Not on file  Social History Narrative   Not on file   Social Determinants of Health   Financial Resource Strain: Not on file  Food Insecurity: Not on file  Transportation Needs: Not on file  Physical Activity: Not on file  Stress: Not on file  Social Connections: Not on file  Intimate Partner Violence: Not on file    Michelle Mcfarland's family history includes Breast cancer in her mother; Diabetes in her father.      Objective:    Vitals:   05/29/21 1504  BP: 110/62  Pulse: 95    Physical Exam Well-developed well-nourished young African-American female in no acute distress.  Accompanied by 2 female partners-  Weight, 171 BMI 29.4  HEENT; nontraumatic normocephalic, EOMI, PE R LA, sclera anicteric. Oropharynx; not examined today Neck; supple, no JVD Cardiovascular; regular rate and rhythm with S1-S2, no murmur rub or gallop Pulmonary; Clear bilaterally Abdomen; soft, she is tender in the epigastrium and across the upper abdomen no guarding or rebound ,nondistended, no palpable mass or hepatosplenomegaly, bowel sounds are active Rectal; not done Skin; benign exam, no jaundice rash or appreciable lesions Extremities; no clubbing cyanosis or edema skin warm and dry Neuro/Psych; alert and oriented x4, grossly nonfocal mood and affect appropriate        Assessment & Plan:   #69 20 year old African-American female with 54-month history of persistent upper abdominal pain described as sharp and constant, some radiation across the abdomen and associated with frequent nausea and vomiting. She did not have any benefit with trial of PPI, states Carafate made her pain worse. Negative ultrasound September 2022 Etiology of current symptoms is not clear, will need to rule out chronic gastropathy, peptic ulcer disease, consider gastroparesis, consider other  intra-abdominal inflammatory process.  With daily cannabis use also have higher suspicion for cannabis hyperemesis type symptoms.  #2 microcytic anemia-chronic per patient rule out iron deficiency, suspect secondary to menorrhagia  Plan; patient will be scheduled for upper endoscopy with Dr. Leone Payor.  Procedure was discussed in detail with the patient including indications risks and benefits and she is agreeable to proceed. If EGD is negative consider CT of the abdomen pelvis with contrast. I have refilled Zofran 4 mg ODT every 6 hours as needed for nausea, suggested she take 1 at bedtime which may help abort the early morning symptoms. I discussed cannabis hyperemesis syndrome with the patient, and advised her that the primary treatment for this is to discontinue cannabis use altogether and that she may need to stop using cannabis for a period of months in order to see if her symptoms will completely resolve.  Plan  Judson Tsan Oswald Hillock PA-C 05/29/2021   Cc: Pediatrics, [facility]

## 2021-06-03 ENCOUNTER — Telehealth: Payer: Self-pay | Admitting: Physician Assistant

## 2021-06-03 MED ORDER — ONDANSETRON 4 MG PO TBDP
4.0000 mg | ORAL_TABLET | Freq: Four times a day (QID) | ORAL | 2 refills | Status: DC | PRN
Start: 2021-06-03 — End: 2021-08-05

## 2021-06-03 NOTE — Telephone Encounter (Signed)
Patient called and said she had gone to [facility] on [address] to pick up her Zofran prescription, but they did not have it.  Can you please resend?  Thank you.

## 2021-06-03 NOTE — Telephone Encounter (Signed)
Zofran resent to [facility] on [address].

## 2021-06-27 ENCOUNTER — Other Ambulatory Visit (INDEPENDENT_AMBULATORY_CARE_PROVIDER_SITE_OTHER): Payer: Medicaid Other

## 2021-06-27 ENCOUNTER — Ambulatory Visit (AMBULATORY_SURGERY_CENTER): Payer: Medicaid Other | Admitting: Internal Medicine

## 2021-06-27 ENCOUNTER — Encounter: Payer: Self-pay | Admitting: Internal Medicine

## 2021-06-27 VITALS — BP 119/81 | HR 77 | Temp 98.4°F | Resp 19 | Ht 64.0 in | Wt 171.0 lb

## 2021-06-27 DIAGNOSIS — D509 Iron deficiency anemia, unspecified: Secondary | ICD-10-CM

## 2021-06-27 DIAGNOSIS — R1013 Epigastric pain: Secondary | ICD-10-CM

## 2021-06-27 LAB — CBC
HCT: 30.4 % — ABNORMAL LOW (ref 36.0–46.0)
Hemoglobin: 9.5 g/dL — ABNORMAL LOW (ref 12.0–15.0)
MCHC: 31.1 g/dL (ref 30.0–36.0)
MCV: 62.1 fl — ABNORMAL LOW (ref 78.0–100.0)
Platelets: 401 10*3/uL — ABNORMAL HIGH (ref 150.0–400.0)
RBC: 4.89 Mil/uL (ref 3.87–5.11)
RDW: 18.6 % — ABNORMAL HIGH (ref 11.5–14.6)
WBC: 6.2 10*3/uL (ref 4.5–10.5)

## 2021-06-27 MED ORDER — SODIUM CHLORIDE 0.9 % IV SOLN: 500.0000 mL | Freq: Once | INTRAVENOUS | Status: DC

## 2021-06-27 NOTE — Progress Notes (Signed)
VS by DT  Pt's states no medical or surgical changes since previsit or office visit.  

## 2021-06-27 NOTE — Progress Notes (Signed)
Report to PACU, RN, vss, BBS= Clear.  

## 2021-06-27 NOTE — Patient Instructions (Addendum)
Esophagus, stomach and upper intestine all look normal.  You will have labs in basement today and we will order a CT scan.  Marijuana may be making you feel this way and stopping this is appropriate to see if that is the case.  I appreciate the opportunity to care for you. Gatha Mayer, MD, [facility]   The office will call you to schedule your CT scan. CT contrast and contrast instruction sent home today.   YOU HAD AN ENDOSCOPIC PROCEDURE TODAY AT [facility]:   Refer to the procedure report that was given to you for any specific questions about what was found during the examination.  If the procedure report does not answer your questions, please call your gastroenterologist to clarify.  If you requested that your care partner not be given the details of your procedure findings, then the procedure report has been included in a sealed envelope for you to review at your convenience later.  YOU SHOULD EXPECT: Some feelings of bloating in the abdomen. Passage of more gas than usual.  Walking can help get rid of the air that was put into your GI tract during the procedure and reduce the bloating. If you had a lower endoscopy (such as a colonoscopy or flexible sigmoidoscopy) you may notice spotting of blood in your stool or on the toilet paper. If you underwent a bowel prep for your procedure, you may not have a normal bowel movement for a few days.  Please Note:  You might notice some irritation and congestion in your nose or some drainage.  This is from the oxygen used during your procedure.  There is no need for concern and it should clear up in a day or so.  SYMPTOMS TO REPORT IMMEDIATELY:  Following upper endoscopy (EGD)  Vomiting of blood or coffee ground material  New chest pain or pain under the shoulder blades  Painful or persistently difficult swallowing  New shortness of breath  Fever of 100F or higher  Black, tarry-looking stools  For urgent or emergent issues, a  gastroenterologist can be reached at any hour by calling [phone]. Do not use MyChart messaging for urgent concerns.    DIET:  We do recommend a small meal at first, but then you may proceed to your regular diet.  Drink plenty of fluids but you should avoid alcoholic beverages for 24 hours.  ACTIVITY:  You should plan to take it easy for the rest of today and you should NOT DRIVE or use heavy machinery until tomorrow (because of the sedation medicines used during the test).    FOLLOW UP: Our staff will call the number listed on your records 48-72 hours following your procedure to check on you and address any questions or concerns that you may have regarding the information given to you following your procedure. If we do not reach you, we will leave a message.  We will attempt to reach you two times.  During this call, we will ask if you have developed any symptoms of COVID 19. If you develop any symptoms (ie: fever, flu-like symptoms, shortness of breath, cough etc.) before then, please call [phone].  If you test positive for Covid 19 in the 2 weeks post procedure, please call and report this information to [place].    If any biopsies were taken you will be contacted by phone or by letter within the next 1-3 weeks.  Please call us at [phone] if you have not heard about  the biopsies in 3 weeks.    SIGNATURES/CONFIDENTIALITY: You and/or your care partner have signed paperwork which will be entered into your electronic medical record.  These signatures attest to the fact that that the information above on your After Visit Summary has been reviewed and is understood.  Full responsibility of the confidentiality of this discharge information lies with you and/or your care-partner.

## 2021-06-27 NOTE — Progress Notes (Signed)
History and Physical Interval Note:  06/27/2021 3:45 PM  Michelle Mcfarland  has presented today for endoscopic procedure(s), with the diagnosis of  Encounter Diagnoses  Name Primary?   Microcytic anemia Yes   Abdominal pain, epigastric   .  The various methods of evaluation and treatment have been discussed with the patient and/or family. After consideration of risks, benefits and other options for treatment, the patient has consented to  the endoscopic procedure(s).   The patient's history has been reviewed, patient examined, no change in status, stable for endoscopic procedure(s).  I have reviewed the patient's chart and labs.  Questions were answered to the patient's satisfaction.     Iva Boop, MD, [facility]

## 2021-06-27 NOTE — Op Note (Signed)
[facility] Patient Name: Michelle Mcfarland Procedure Date: 06/27/2021 3:41 PM MRN: [id number] Endoscopist: Iva Boop , MD Age: 21 Referring MD:  Date of Birth: February 13, 2001 Gender: Female Account #: [id number] Procedure:                Upper GI endoscopy Indications:              Epigastric abdominal pain Medicines:                Propofol per Anesthesia, Monitored Anesthesia Care Procedure:                Pre-Anesthesia Assessment:                           - Prior to the procedure, a History and Physical                            was performed, and patient medications and                            allergies were reviewed. The patient's tolerance of                            previous anesthesia was also reviewed. The risks                            and benefits of the procedure and the sedation                            options and risks were discussed with the patient.                            All questions were answered, and informed consent                            was obtained. Prior Anticoagulants: The patient has                            taken no previous anticoagulant or antiplatelet                            agents. ASA Grade Assessment: II - A patient with                            mild systemic disease. After reviewing the risks                            and benefits, the patient was deemed in                            satisfactory condition to undergo the procedure.                           After obtaining informed consent, the endoscope was  passed under direct vision. Throughout the                            procedure, the patient's blood pressure, pulse, and                            oxygen saturations were monitored continuously. The                            Endoscope was introduced through the mouth, and                            advanced to the second part of duodenum. The upper                            GI  endoscopy was accomplished without difficulty.                            The patient tolerated the procedure well. Scope In: Scope Out: Findings:                 The esophagus was normal.                           The stomach was normal.                           The examined duodenum was normal.                           The cardia and gastric fundus were normal on                            retroflexion. Complications:            No immediate complications. Estimated Blood Loss:     Estimated blood loss: none. Impression:               - Normal esophagus.                           - Normal stomach.                           - Normal examined duodenum.                           - No specimens collected. Recommendation:           - Patient has a contact number available for                            emergencies. The signs and symptoms of potential                            delayed complications were discussed with the  patient. Return to normal activities tomorrow.                            Written discharge instructions were provided to the                            patient.                           - Resume previous diet.                           - Continue present medications.                           - CBC, ferritin and TIBC today                           Office to order CT abd and pelvis w/ contrast - dx:                            epigastric pain, periumbilical pain, nausea and                            vomiting Iva Booparl E Karrington Mccravy, MD 06/27/2021 4:03:37 PM This report has been signed electronically.

## 2021-06-28 ENCOUNTER — Other Ambulatory Visit: Payer: Self-pay

## 2021-06-28 DIAGNOSIS — D509 Iron deficiency anemia, unspecified: Secondary | ICD-10-CM

## 2021-06-28 DIAGNOSIS — R1033 Periumbilical pain: Secondary | ICD-10-CM

## 2021-06-28 DIAGNOSIS — R112 Nausea with vomiting, unspecified: Secondary | ICD-10-CM

## 2021-06-28 DIAGNOSIS — R1013 Epigastric pain: Secondary | ICD-10-CM

## 2021-06-28 MED ORDER — IRON (FERROUS SULFATE) 325 (65 FE) MG PO TABS: 1.0000 | ORAL_TABLET | Freq: Every day | ORAL | 8 refills | Status: DC

## 2021-07-01 ENCOUNTER — Telehealth: Payer: Self-pay | Admitting: *Deleted

## 2021-07-01 ENCOUNTER — Telehealth: Payer: Self-pay

## 2021-07-01 NOTE — Telephone Encounter (Signed)
Second attempt follow up call to pt, no answer.  

## 2021-07-01 NOTE — Telephone Encounter (Signed)
Phone busy on first follow up call

## 2021-07-05 ENCOUNTER — Encounter (HOSPITAL_COMMUNITY): Payer: Self-pay

## 2021-07-05 ENCOUNTER — Ambulatory Visit (HOSPITAL_COMMUNITY): Payer: Medicaid Other

## 2021-08-02 IMAGING — DX DG CHEST 2V
2 series · 2 of 2 positions shown · non-contrast
Comparison: 10/23/2020

CLINICAL DATA: Hemoptysis

EXAM:
CHEST - 2 VIEW

[chest pa]
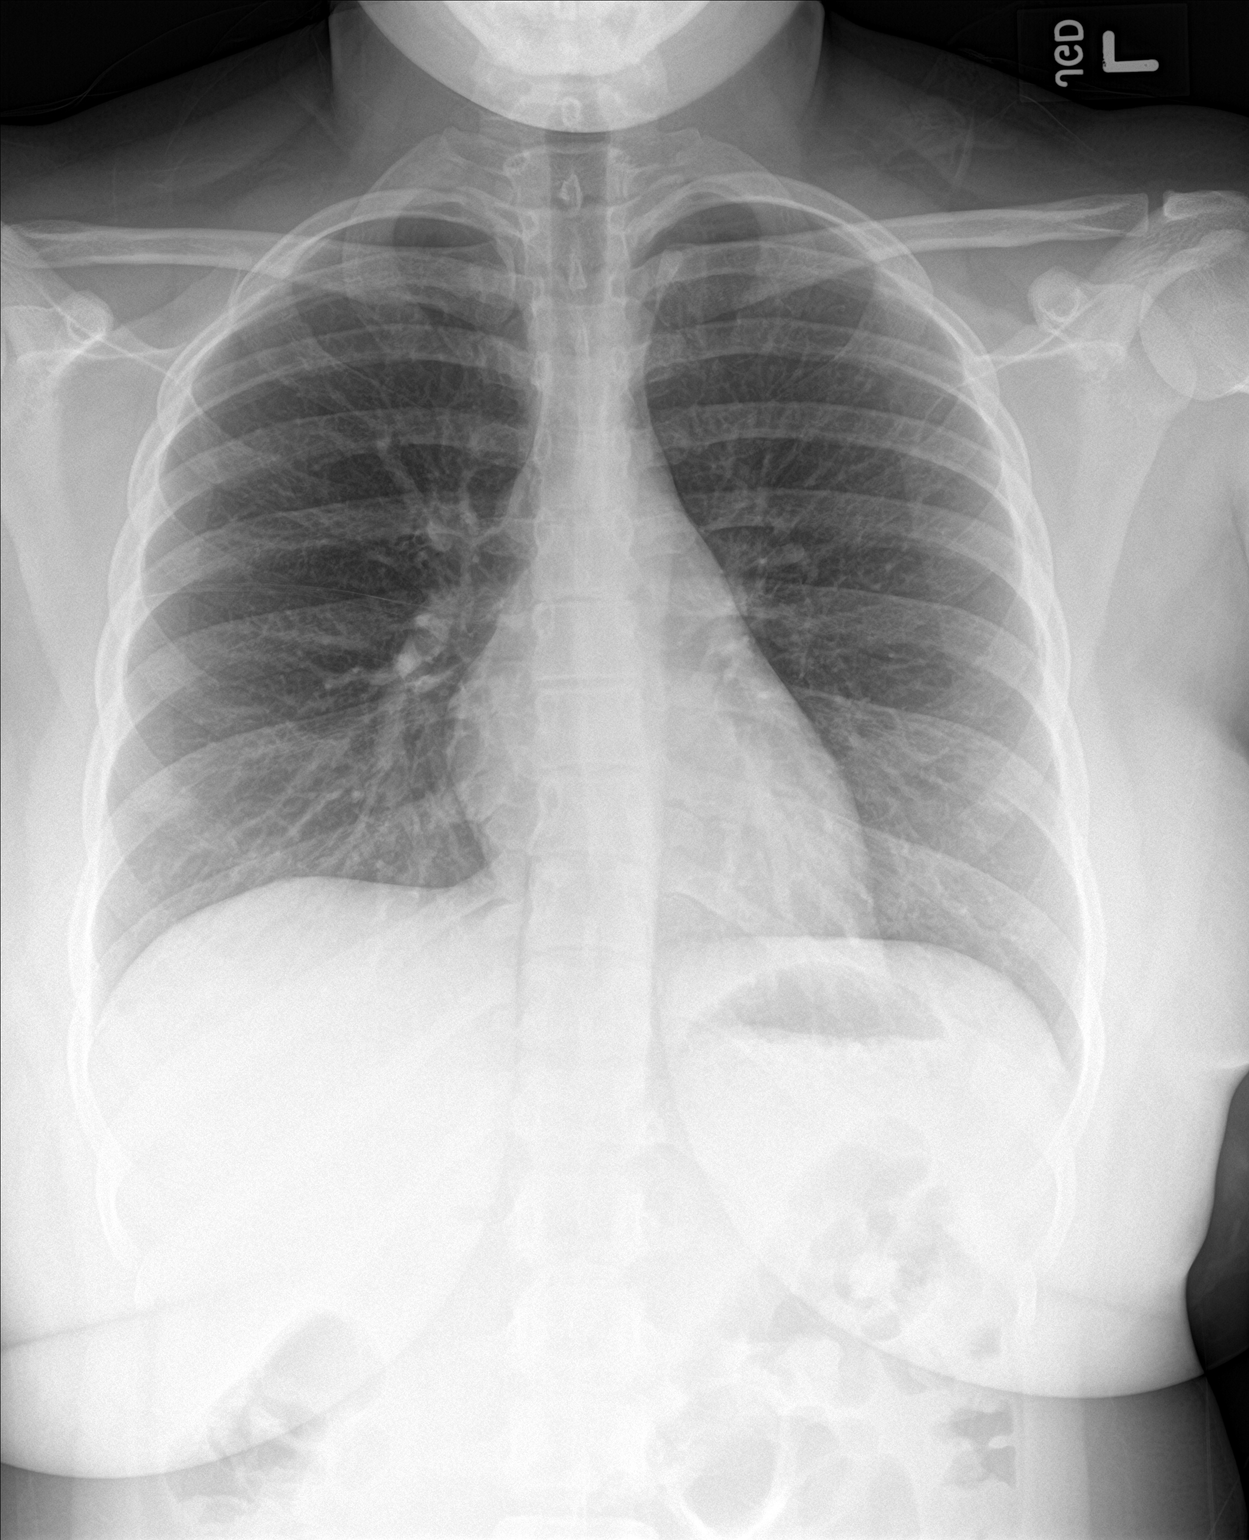

[chest lat]
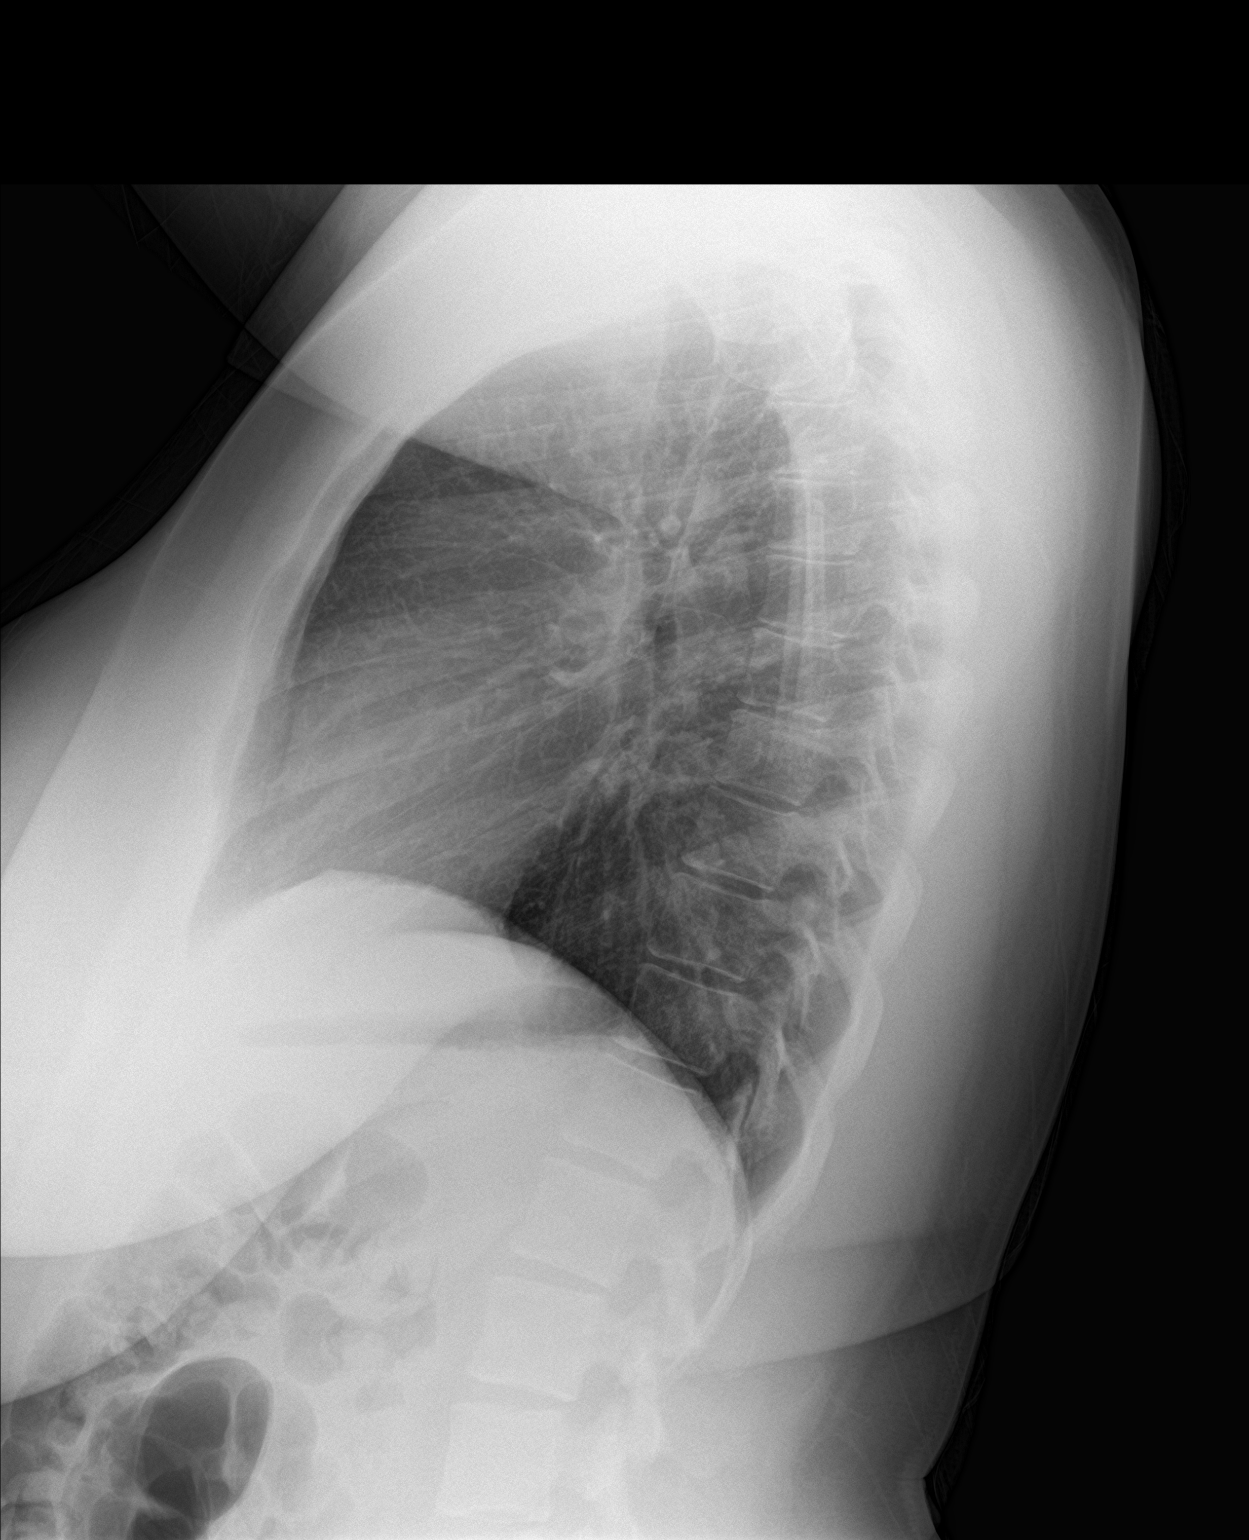

[2 of 2 positions shown; findings below may reference images not displayed]

FINDINGS: The heart size and mediastinal contours are within normal limits.
Both lungs are clear. The visualized skeletal structures are
unremarkable.
IMPRESSION: No active cardiopulmonary disease.

## 2021-08-05 ENCOUNTER — Other Ambulatory Visit: Payer: Self-pay

## 2021-08-05 ENCOUNTER — Ambulatory Visit
Admission: RE | Admit: 2021-08-05 | Discharge: 2021-08-05 | Disposition: A | Discharge status: Home / Self Care | Payer: Medicaid Other | Source: Ambulatory Visit | Attending: Student | Admitting: Student

## 2021-08-05 VITALS — BP 114/60 | HR 97 | Temp 98.8°F | Resp 18

## 2021-08-05 DIAGNOSIS — R1013 Epigastric pain: Secondary | ICD-10-CM

## 2021-08-05 MED ORDER — LIDOCAINE VISCOUS HCL 2 % MT SOLN
15.0000 mL | Freq: Once | OROMUCOSAL | Status: AC
  Administered 2021-08-05: 15 mL via ORAL

## 2021-08-05 MED ORDER — ONDANSETRON 8 MG PO TBDP: 8.0000 mg | ORAL_TABLET | Freq: Three times a day (TID) | ORAL | 0 refills | Status: DC | PRN

## 2021-08-05 MED ORDER — ALUM & MAG HYDROXIDE-SIMETH 200-200-20 MG/5ML PO SUSP
30.0000 mL | Freq: Once | ORAL | Status: AC
  Administered 2021-08-05: 30 mL via ORAL

## 2021-08-05 NOTE — ED Triage Notes (Signed)
Pt reports having abd pain that is not improving.

## 2021-08-05 NOTE — Discharge Instructions (Addendum)
-  Take the Zofran (ondansetron) up to 3 times daily for nausea and vomiting. Dissolve one pill under your tongue or between your teeth and your cheek. -Call your GI doctor about your ongoing symptoms -Head to ED if severe abdominal pain, vomiting blood, etc

## 2021-08-05 NOTE — ED Provider Notes (Signed)
[facility]    CSN: [id number] Arrival date & time: 08/05/21  1318      History   Chief Complaint No chief complaint on file.   HPI Michelle Mcfarland is a 21 y.o. female presenting with abdominal pain.  Multiple visits for epigastric pain in the last year, followed by GI for this. Per their last note 05/29/22, "34-month history of persistent upper abdominal pain described as sharp and constant, some radiation across the abdomen and associated with frequent nausea and vomiting. She did not have any benefit with trial of PPI, states Carafate made her pain worse. Negative ultrasound September 2022.  Etiology of current symptoms is not clear, will need to rule out chronic gastropathy, peptic ulcer disease, consider gastroparesis, consider other intra-abdominal inflammatory process.  With daily cannabis use also have higher suspicion for cannabis hyperemesis type symptoms." EGD was normal 06/27/21. They ordered a CT scan on 07/05/2021, this was apparently canceled and rescheduled for 3/23. Currently only medication for this is zofran ODT - which she is out of. She does also take daily iron supplement for iron deficiency.  Denies any change in symptoms, states that she continues to have epigastric pain throughout the day, this is constant and not aggravated by food, empty stomach, etc.  Also with nausea and vomiting throughout the day, unable to identify triggers.  Denies exacerbation of symptoms.  There is never any hematemesis or hematochezia.  HPI  Past Medical History:  Diagnosis Date   Asthma     There are no problems to display for this patient.   History reviewed. No pertinent surgical history.  OB History   No obstetric history on file.      Home Medications    Prior to Admission medications   Medication Sig Start Date End Date Taking? Authorizing Provider  Iron, Ferrous Sulfate, 325 (65 Fe) MG TABS Take 1 tablet by mouth daily. 06/28/21   Gatha Mayer, MD  ondansetron  (ZOFRAN-ODT) 8 MG disintegrating tablet Take 1 tablet (8 mg total) by mouth every 8 (eight) hours as needed for nausea or vomiting. 08/05/21  Yes Hazel Sams, PA-C  albuterol (VENTOLIN HFA) 108 (90 Base) MCG/ACT inhaler Inhale 1-2 puffs into the lungs every 6 (six) hours as needed for wheezing or shortness of breath. 11/22/20   Jaynee Eagles, PA-C    Family History Family History  Problem Relation Age of Onset   Breast cancer Mother    Diabetes Father    Colon cancer Neg Hx    Esophageal cancer Neg Hx    Rectal cancer Neg Hx    Stomach cancer Neg Hx     Social History Social History   Tobacco Use   Smoking status: Never   Smokeless tobacco: Never  Vaping Use   Vaping Use: Every day  Substance Use Topics   Alcohol use: Yes    Comment: occasionally   Drug use: No     Allergies   Patient has no known allergies.   Review of Systems Review of Systems  Gastrointestinal:  Positive for abdominal pain, nausea and vomiting.  All other systems reviewed and are negative.   Physical Exam Triage Vital Signs ED Triage Vitals  Enc Vitals Group     BP      Pulse      Resp      Temp      Temp src      SpO2      Weight  Height      Head Circumference      Peak Flow      Pain Score      Pain Loc      Pain Edu?      Excl. in [place]?    No data found.  Updated Vital Signs BP 114/60 (BP Location: Left Arm)    Pulse 97    Temp 98.8 F (37.1 C) (Oral)    Resp 18    LMP 07/02/2021 (Approximate)    SpO2 98%   Visual Acuity Right Eye Distance:   Left Eye Distance:   Bilateral Distance:    Right Eye Near:   Left Eye Near:    Bilateral Near:     Physical Exam Vitals reviewed.  Constitutional:      General: She is not in acute distress.    Appearance: Normal appearance. She is not ill-appearing.  HENT:     Head: Normocephalic and atraumatic.     Mouth/Throat:     Mouth: Mucous membranes are moist.     Comments: Moist mucous membranes Eyes:     Extraocular  Movements: Extraocular movements intact.     Pupils: Pupils are equal, round, and reactive to light.  Cardiovascular:     Rate and Rhythm: Normal rate and regular rhythm.     Heart sounds: Normal heart sounds.  Pulmonary:     Effort: Pulmonary effort is normal.     Breath sounds: Normal breath sounds. No wheezing, rhonchi or rales.  Abdominal:     General: Bowel sounds are normal. There is no distension.     Palpations: Abdomen is soft. There is no mass.     Tenderness: There is abdominal tenderness in the right upper quadrant and epigastric area. There is no right CVA tenderness, left CVA tenderness, guarding or rebound.     Comments: Diffusely TTP, worse in the epigastric and RUQ. Comfortable throughout exam. No guarding, rebound, mass, hernia.   Skin:    General: Skin is warm.     Capillary Refill: Capillary refill takes less than 2 seconds.     Comments: Good skin turgor  Neurological:     General: No focal deficit present.     Mental Status: She is alert and oriented to person, place, and time.  Psychiatric:        Mood and Affect: Mood normal.        Behavior: Behavior normal.     UC Treatments / Results  Labs (all labs ordered are listed, but only abnormal results are displayed) Labs Reviewed - No data to display  EKG   Radiology No results found.  Procedures Procedures (including critical care time)  Medications Ordered in UC Medications  alum & mag hydroxide-simeth (MAALOX/MYLANTA) 200-200-20 MG/5ML suspension 30 mL (30 mLs Oral Given 08/05/21 1356)    And  lidocaine (XYLOCAINE) 2 % viscous mouth solution 15 mL (15 mLs Oral Given 08/05/21 1356)    Initial Impression / Assessment and Plan / UC Course  I have reviewed the triage vital signs and the nursing notes.  Pertinent labs & imaging results that were available during my care of the patient were reviewed by me and considered in my medical decision making (see chart for details).     This patient is a  very pleasant 21 y.o. year old female presenting with epigastric pain x4 months. Followed by GI for this.  Has failed treatment with PPI, Carafate, etc.  EGD was normal.  CT is scheduled  for 3/23.  Patient denies any change in symptoms.  Trial of GI cocktail today, Zofran ODT sent.  Follow-up with GI for further concerns..   Final Clinical Impressions(s) / UC Diagnoses   Final diagnoses:  Abdominal pain, epigastric     Discharge Instructions      -Take the Zofran (ondansetron) up to 3 times daily for nausea and vomiting. Dissolve one pill under your tongue or between your teeth and your cheek. -Call your GI doctor about your ongoing symptoms -Head to ED if severe abdominal pain, vomiting blood, etc     ED Prescriptions     Medication Sig Dispense Auth. Provider   ondansetron (ZOFRAN-ODT) 8 MG disintegrating tablet Take 1 tablet (8 mg total) by mouth every 8 (eight) hours as needed for nausea or vomiting. 20 tablet Hazel Sams, PA-C      PDMP not reviewed this encounter.   Hazel Sams, PA-C 08/05/21 1400

## 2021-08-30 ENCOUNTER — Ambulatory Visit (INDEPENDENT_AMBULATORY_CARE_PROVIDER_SITE_OTHER): Payer: Medicaid Other | Admitting: Internal Medicine

## 2021-08-30 ENCOUNTER — Encounter: Payer: Self-pay | Admitting: Internal Medicine

## 2021-08-30 VITALS — BP 124/84 | HR 72 | Ht 64.0 in | Wt 160.0 lb

## 2021-08-30 DIAGNOSIS — R10817 Generalized abdominal tenderness: Secondary | ICD-10-CM

## 2021-08-30 DIAGNOSIS — R1013 Epigastric pain: Secondary | ICD-10-CM

## 2021-08-30 DIAGNOSIS — F1290 Cannabis use, unspecified, uncomplicated: Secondary | ICD-10-CM

## 2021-08-30 DIAGNOSIS — D509 Iron deficiency anemia, unspecified: Secondary | ICD-10-CM | POA: Diagnosis not present

## 2021-08-30 DIAGNOSIS — N926 Irregular menstruation, unspecified: Secondary | ICD-10-CM

## 2021-08-30 DIAGNOSIS — G8929 Other chronic pain: Secondary | ICD-10-CM

## 2021-08-30 MED ORDER — ONDANSETRON 8 MG PO TBDP: 8.0000 mg | ORAL_TABLET | Freq: Three times a day (TID) | ORAL | 1 refills | Status: DC | PRN

## 2021-08-30 MED ORDER — DICYCLOMINE HCL 20 MG PO TABS: 20.0000 mg | ORAL_TABLET | Freq: Three times a day (TID) | ORAL | 1 refills | Status: DC

## 2021-08-30 MED ORDER — IRON (FERROUS SULFATE) 325 (65 FE) MG PO TABS: 1.0000 | ORAL_TABLET | Freq: Every day | ORAL | 8 refills | Status: DC

## 2021-08-30 MED ORDER — ALBUTEROL SULFATE HFA 108 (90 BASE) MCG/ACT IN AERS: 1.0000 | INHALATION_SPRAY | Freq: Four times a day (QID) | RESPIRATORY_TRACT | 0 refills | Status: DC | PRN

## 2021-08-30 NOTE — Patient Instructions (Signed)
You will have the CT scan. ?We will refer to gynecology. ? ?Please quit all marijuana for several weeks to see if that helps you feel better. ? ?Obtain a PCP - primary care provider. ? ?You have been scheduled for a CT scan of the abdomen and pelvis at [facility], 1st floor Radiology. You are scheduled on 09/10/2021  at 8:15am. You should arrive 15 minutes prior to your appointment time for registration.  Please pick up 2 bottles of contrast from [facility] at least 3 days prior to your scan. The solution may taste better if refrigerated, but do NOT add ice or any other liquid to this solution. Shake well before drinking.  ? ?Please follow the written instructions below on the day of your exam:  ? ?1) Do not eat anything after 4:30am (4 hours prior to your test)  ? ?2) Drink 1 bottle of contrast @ 6:30am (2 hours prior to your exam)  Remember to shake well before drinking and do NOT pour over ice. ?    Drink 1 bottle of contrast @ 7:30am (1 hour prior to your exam)  ? ?You may take any medications as prescribed with a small amount of water, if necessary. If you take any of the following medications: METFORMIN, GLUCOPHAGE, GLUCOVANCE, AVANDAMET, RIOMET, FORTAMET, ACTOPLUS MET, JANUMET, Placer or METAGLIP, you MAY be asked to HOLD this medication 48 hours AFTER the exam.  ? ?The purpose of you drinking the oral contrast is to aid in the visualization of your intestinal tract. The contrast solution may cause some diarrhea. Depending on your individual set of symptoms, you may also receive an intravenous injection of x-ray contrast/dye. Plan on being at [facility] for 45 minutes or longer, depending on the type of exam you are having performed.  ? ?If you have any questions regarding your exam or if you need to reschedule, you may call [facility] at [phone] between the hours of 8:00 am and 5:00 pm, Monday-Friday.  ?  ? ?Due to recent changes in healthcare laws, you may see the results of  your imaging and laboratory studies on MyChart before your provider has had a chance to review them.  We understand that in some cases there may be results that are confusing or concerning to you. Not all laboratory results come back in the same time frame and the provider may be waiting for multiple results in order to interpret others.  Please give [place] 48 hours in order for your provider to thoroughly review all the results before contacting the office for clarification of your results.   ? ?If you are age 50 or older, your body mass index should be between 23-30. Your Body mass index is 27.46 kg/m?Marland Kitchen If this is out of the aforementioned range listed, please consider follow up with your Primary Care Provider. ? ?If you are age 63 or younger, your body mass index should be between 19-25. Your Body mass index is 27.46 kg/m?Marland Kitchen If this is out of the aformentioned range listed, please consider follow up with your Primary Care Provider.  ? ?________________________________________________________ ? ?The [facility] providers would like to encourage you to use [facility] to communicate with providers for non-urgent requests or questions.  Due to long hold times on the telephone, sending your provider a message by [facility] may be a faster and more efficient way to get a response.  Please allow 48 business hours for a response.  Please remember that this is for non-urgent  requests.  ?_______________________________________________________  ? ? ?I appreciate the  opportunity to care for you ? ?Thank You  ? ?Ronney Lion ? ? ? ?

## 2021-08-30 NOTE — Progress Notes (Signed)
? ?  Paulino Door 21 y.o. 2000/11/25 [id number] ? ?Assessment & Plan:  ? ?Encounter Diagnoses  ?Name Primary?  ? Generalized abdominal tenderness without rebound tenderness Yes  ? Abdominal pain, chronic, epigastric   ? Marijuana use, continuous   ? Iron deficiency anemia suspected on the basis of microcytic anemia   ? Abnormal menses   ? ? ?CT abdomen and pelvis with contrast ?Gynecology referral ?Obtain primary care provider ?I have refilled her asthma inhaler (albuterol) 1 time but she needs primary care to carry this out further. ?Further GI follow-up and plans pending the above ? ?Symptomatic treatment with dicyclomine 20 mg before meals and at bedtime which is 4 times a day and ondansetron 4 mg as needed every 8 hours nausea ?Ferrous sulfate 325 mg daily ? ? ? ?Subjective:  ? ?Chief Complaint: Abdominal pain ? ?HPI ?21 year old African-American woman presents with her 2 friends/partners who has been struggling with abdominal pain since the fall of last year.  Epigastric pain predominantly.  An EGD has been normal.  H. pylori stool antigen testing normal.  Right upper quadrant ultrasound normal.  Labs notable for microcytic anemia.  She cannot remember if she is taking her iron.  Menses may be heavy at times they are regular but they are not more frequent than a typical monthly cycle.  Last 1 was a little lighter than usual.  She has never seen a gynecologist.  Does not have primary care at this time and does not know who is on her list from Medicaid.  She says her mom usually checks those things.  Previously seen at [facility]. ? ? ?She continues with daily abdominal pain in the epigastrium without clear triggers.  Ondansetron helps with associated nausea at times.  It affects her sleep.  She is not a good sleeper otherwise.  She either smokes and/or eats marijuana every single day.  There has been a period of time where she has been off for months but its not been during this period of time  when she has had abdominal pain it seems. ? ?No Known Allergies ?Current Meds  ?Medication Sig  ? dicyclomine (BENTYL) 20 MG tablet Take 1 tablet (20 mg total) by mouth 4 (four) times daily -  before meals and at bedtime.  ? [DISCONTINUED] albuterol (VENTOLIN HFA) 108 (90 Base) MCG/ACT inhaler Inhale 1-2 puffs into the lungs every 6 (six) hours as needed for wheezing or shortness of breath.  ? [DISCONTINUED] Iron, Ferrous Sulfate, 325 (65 Fe) MG TABS Take 1 tablet by mouth daily.  ? [DISCONTINUED] ondansetron (ZOFRAN-ODT) 8 MG disintegrating tablet Take 1 tablet (8 mg total) by mouth every 8 (eight) hours as needed for nausea or vomiting.  ? ?Past Medical History:  ?Diagnosis Date  ? Asthma   ? Hypercalcemia   ? Iron deficiency anemia   ? ?History reviewed. No pertinent surgical history. ?Social History  ? ?Social History Narrative  ? Uses marijuana every day  ? ?family history includes Breast cancer in her mother; Diabetes in her father. ? ? ?Review of Systems ?As above ? ?Objective:  ? Physical Exam ?BP 124/84   Pulse 72   Ht 5\' 4"  (1.626 m)   Wt 160 lb (72.6 kg)   BMI 27.46 kg/m?  ?Young black woman no acute distress ?Abdomen is soft and diffusely tender without focal mass or lesion or tenderness.  There is no organomegaly. ? ?

## 2021-09-02 ENCOUNTER — Other Ambulatory Visit: Payer: Self-pay | Admitting: Internal Medicine

## 2021-09-02 DIAGNOSIS — N926 Irregular menstruation, unspecified: Secondary | ICD-10-CM

## 2021-09-10 ENCOUNTER — Encounter (HOSPITAL_COMMUNITY): Payer: Self-pay

## 2021-09-10 ENCOUNTER — Ambulatory Visit (HOSPITAL_COMMUNITY): Payer: Medicaid Other

## 2021-09-17 ENCOUNTER — Ambulatory Visit (HOSPITAL_COMMUNITY): Payer: Medicaid Other

## 2021-09-17 ENCOUNTER — Encounter (HOSPITAL_COMMUNITY): Payer: Self-pay

## 2021-10-11 ENCOUNTER — Telehealth: Payer: Self-pay | Admitting: Internal Medicine

## 2021-10-11 ENCOUNTER — Other Ambulatory Visit: Payer: Self-pay

## 2021-10-11 MED ORDER — ONDANSETRON 8 MG PO TBDP: 8.0000 mg | ORAL_TABLET | Freq: Three times a day (TID) | ORAL | 1 refills | Status: DC | PRN

## 2021-10-11 NOTE — Telephone Encounter (Signed)
Inbound call from patient requesting refill for ondansetron. Please advise.  ?

## 2021-10-11 NOTE — Telephone Encounter (Signed)
Refill of Zofran sent to patients pharmacy.  ?

## 2021-11-21 ENCOUNTER — Telehealth: Payer: Self-pay | Admitting: Internal Medicine

## 2021-11-21 MED ORDER — ONDANSETRON 8 MG PO TBDP: 8.0000 mg | ORAL_TABLET | Freq: Three times a day (TID) | ORAL | 0 refills | Status: DC | PRN

## 2021-11-21 NOTE — Telephone Encounter (Signed)
Patient called requesting a refill of ondansetron.  Thank you.

## 2021-11-21 NOTE — Telephone Encounter (Signed)
Ondansetron refilled as requested.

## 2021-12-09 MED ORDER — ONDANSETRON 8 MG PO TBDP: 8.0000 mg | ORAL_TABLET | Freq: Three times a day (TID) | ORAL | 0 refills | Status: DC | PRN

## 2021-12-09 NOTE — Addendum Note (Signed)
Addended by: [place], Jasan Doughtie E on: 12/09/2021 12:23 PM   Modules accepted: Orders

## 2021-12-09 NOTE — Telephone Encounter (Signed)
I called Michelle Mcfarland to inquire about her pending CT. She plans to have it done she states she is having transportation issues. She has the # to call and set it up. I told her I would send in her ondansetron refill. Confirmed pharmacy.

## 2021-12-09 NOTE — Telephone Encounter (Signed)
Patient called stating she only has 2 pills left of Ondansetron from the refill from 6/1. Patient is requesting another refill be sent. Please advise.

## 2021-12-26 ENCOUNTER — Other Ambulatory Visit: Payer: Self-pay | Admitting: Internal Medicine

## 2021-12-26 NOTE — Telephone Encounter (Signed)
Generic zofran refilled and patient informed. I asked her about the pending CT scan and she said she is still having transportation issues.

## 2021-12-26 NOTE — Telephone Encounter (Signed)
Inbound call from patient requesting Ondansetron refill.

## 2022-01-23 ENCOUNTER — Other Ambulatory Visit: Payer: Self-pay | Admitting: Internal Medicine

## 2022-02-07 ENCOUNTER — Other Ambulatory Visit: Payer: Self-pay | Admitting: Internal Medicine

## 2022-02-07 NOTE — Telephone Encounter (Signed)
Patient called again requesting a refill for the ondansetron 8 mg.  If there is an issue, please call patient and advise.  Thank you.

## 2022-03-03 ENCOUNTER — Other Ambulatory Visit: Payer: Self-pay | Admitting: Internal Medicine

## 2022-03-21 ENCOUNTER — Other Ambulatory Visit: Payer: Self-pay | Admitting: Internal Medicine

## 2022-03-26 ENCOUNTER — Telehealth: Payer: Self-pay | Admitting: Internal Medicine

## 2022-03-26 MED ORDER — ONDANSETRON 8 MG PO TBDP: ORAL_TABLET | ORAL | 0 refills | Status: DC

## 2022-03-26 NOTE — Telephone Encounter (Signed)
I called and left her a detailed message that I resent her rx of on[facility]ansetron to [facility] per her request.

## 2022-03-26 NOTE — Telephone Encounter (Signed)
Patient called states she is having trouble picking up her Ondansetron 8mg   medication at her pharmacy. Wondering if [facility] could be [facility] to [facility] on [address] phone # [phone].

## 2022-05-05 ENCOUNTER — Telehealth: Payer: Self-pay | Admitting: Internal Medicine

## 2022-05-05 MED ORDER — ONDANSETRON 8 MG PO TBDP: 8.0000 mg | ORAL_TABLET | Freq: Three times a day (TID) | ORAL | 0 refills | Status: DC | PRN

## 2022-05-05 NOTE — Telephone Encounter (Signed)
Patient called to get [facility]ill to [facility]

## 2022-05-05 NOTE — Telephone Encounter (Signed)
Med refill sent on 10-4. Sent another #30 - Patient last seen 08-2021. Patient should schedule an office visit for further refills

## 2022-06-05 ENCOUNTER — Telehealth: Payer: Self-pay | Admitting: Internal Medicine

## 2022-06-05 MED ORDER — ONDANSETRON 8 MG PO TBDP: 8.0000 mg | ORAL_TABLET | Freq: Three times a day (TID) | ORAL | 0 refills | Status: DC | PRN

## 2022-06-05 NOTE — Telephone Encounter (Signed)
#  [facility] on [address]. Patient instructed to keep her Jan. Appointment for further refills

## 2022-06-05 NOTE — Telephone Encounter (Signed)
Inbound call from patient stating that she needs a refill for Zofran and would like [facility] sent [facility] the [facility] on [address] in [place]. Patient was scheduled for appointment on 1/5 at 3:00.

## 2022-06-27 ENCOUNTER — Ambulatory Visit: Payer: Medicaid Other | Admitting: Physician Assistant

## 2022-07-31 ENCOUNTER — Other Ambulatory Visit (INDEPENDENT_AMBULATORY_CARE_PROVIDER_SITE_OTHER): Payer: Medicaid Other

## 2022-07-31 ENCOUNTER — Encounter: Payer: Self-pay | Admitting: Physician Assistant

## 2022-07-31 ENCOUNTER — Ambulatory Visit (INDEPENDENT_AMBULATORY_CARE_PROVIDER_SITE_OTHER): Payer: Medicaid Other | Admitting: Physician Assistant

## 2022-07-31 VITALS — BP 110/62 | HR 97 | Ht 66.0 in | Wt 156.0 lb

## 2022-07-31 DIAGNOSIS — Z862 Personal history of diseases of the blood and blood-forming organs and certain disorders involving the immune mechanism: Secondary | ICD-10-CM

## 2022-07-31 DIAGNOSIS — G8929 Other chronic pain: Secondary | ICD-10-CM

## 2022-07-31 DIAGNOSIS — R109 Unspecified abdominal pain: Secondary | ICD-10-CM

## 2022-07-31 DIAGNOSIS — R110 Nausea: Secondary | ICD-10-CM

## 2022-07-31 DIAGNOSIS — F1290 Cannabis use, unspecified, uncomplicated: Secondary | ICD-10-CM

## 2022-07-31 DIAGNOSIS — R1084 Generalized abdominal pain: Secondary | ICD-10-CM

## 2022-07-31 LAB — CBC WITH DIFFERENTIAL/PLATELET
Basophils Absolute: 0 10*3/uL (ref 0.0–0.1)
Basophils Relative: 0.2 % (ref 0.0–3.0)
Eosinophils Absolute: 0.1 10*3/uL (ref 0.0–0.7)
Eosinophils Relative: 1.2 % (ref 0.0–5.0)
HCT: 36.1 % (ref 36.0–46.0)
Hemoglobin: 11.1 g/dL — ABNORMAL LOW (ref 12.0–15.0)
Lymphocytes Relative: 37 % (ref 12.0–46.0)
Lymphs Abs: 2.4 10*3/uL (ref 0.7–4.0)
MCHC: 30.8 g/dL (ref 30.0–36.0)
MCV: 66.5 fl — ABNORMAL LOW (ref 78.0–100.0)
Monocytes Absolute: 0.4 10*3/uL (ref 0.1–1.0)
Monocytes Relative: 6.5 % (ref 3.0–12.0)
Neutro Abs: 3.5 10*3/uL (ref 1.4–7.7)
Neutrophils Relative %: 55.1 % (ref 43.0–77.0)
Platelets: 390 10*3/uL (ref 150.0–400.0)
RBC: 5.42 Mil/uL — ABNORMAL HIGH (ref 3.87–5.11)
RDW: 17.2 % — ABNORMAL HIGH (ref 11.5–15.5)
WBC: 6.4 10*3/uL (ref 4.0–10.5)

## 2022-07-31 LAB — IBC + FERRITIN
Ferritin: 8.2 ng/mL — ABNORMAL LOW (ref 10.0–291.0)
Iron: 29 ug/dL — ABNORMAL LOW (ref 42–145)
Saturation Ratios: 6.8 % — ABNORMAL LOW (ref 20.0–50.0)
TIBC: 428.4 ug/dL (ref 250.0–450.0)
Transferrin: 306 mg/dL (ref 212.0–360.0)

## 2022-07-31 MED ORDER — ALBUTEROL SULFATE HFA 108 (90 BASE) MCG/ACT IN AERS: 1.0000 | INHALATION_SPRAY | Freq: Four times a day (QID) | RESPIRATORY_TRACT | 0 refills | Status: DC | PRN

## 2022-07-31 MED ORDER — ONDANSETRON 8 MG PO TBDP: 8.0000 mg | ORAL_TABLET | Freq: Three times a day (TID) | ORAL | 0 refills | Status: DC | PRN

## 2022-07-31 MED ORDER — PROMETHAZINE HCL 25 MG PO TABS: ORAL_TABLET | ORAL | 2 refills | Status: DC

## 2022-07-31 MED ORDER — DICYCLOMINE HCL 20 MG PO TABS: 20.0000 mg | ORAL_TABLET | Freq: Four times a day (QID) | ORAL | 6 refills | Status: DC | PRN

## 2022-07-31 NOTE — Progress Notes (Signed)
Subjective:    Patient ID: Michelle Mcfarland, female    DOB: 2001/01/17, 22 y.o.   MRN: [id number]  HPI Michelle Mcfarland is a 22 year old African-American female, established with Dr. Carlean Purl, and last seen in the office in March 2023 per Dr. Carlean Purl with diagnosis of chronic abdominal pain, chronic nausea, chronic cannabis use, and iron deficiency anemia felt secondary to menorrhagia. CT of the abdomen and pelvis was ordered but patient did not pursue, she was also asked to obtain a primary care provider, and gynecologist. She says she has not seen any provider since she was last here. Today she is here for refills of her medications and is asking if there is an alternative to Zofran 8 mg to help with nausea.  She says she continues to get nauseated frequently, almost daily, vomits intermittently though not usually if using an antiemetic.  Smell seem to aggravate her symptoms and she says sometimes after she vomits she will have a headache.  She is not complaining of any ongoing abdominal pain currently but says that she does use the dicyclomine fairly regularly.  No problems with constipation or diarrhea currently.  Weight has been stable. No changes with her menses says usually last 7 to 8 days and intermittently heavy.  She is not currently taking an iron supplement though she had previously been on one. When asked about continued cannabis use she says that she is not using cannabis every day.  Review of Systems Pertinent positive and negative review of systems were noted in the above HPI section.  All other review of systems was otherwise negative.   Outpatient Encounter Medications as of 07/31/2022  Medication Sig   Iron, Ferrous Sulfate, 325 (65 Fe) MG TABS Take 1 tablet by mouth daily.   promethazine (PHENERGAN) 25 MG tablet Take 1/2 tablet every 6 hours as needed for severe nausea only   [DISCONTINUED] albuterol (VENTOLIN HFA) 108 (90 Base) MCG/ACT inhaler Inhale 1-2 puffs into the lungs every 6 (six)  hours as needed for wheezing or shortness of breath.   [DISCONTINUED] dicyclomine (BENTYL) 20 MG tablet Take 1 tablet (20 mg total) by mouth 4 (four) times daily -  before meals and at bedtime.   [DISCONTINUED] ondansetron (ZOFRAN-ODT) 8 MG disintegrating tablet Take 1 tablet (8 mg total) by mouth every 8 (eight) hours as needed for nausea or vomiting. Please keep your January for any further refills. Thank you   albuterol (VENTOLIN HFA) 108 (90 Base) MCG/ACT inhaler Inhale 1-2 puffs into the lungs every 6 (six) hours as needed for wheezing or shortness of breath.   dicyclomine (BENTYL) 20 MG tablet Take 1 tablet (20 mg total) by mouth every 6 (six) hours as needed for spasms.   ondansetron (ZOFRAN-ODT) 8 MG disintegrating tablet Take 1 tablet (8 mg total) by mouth every 8 (eight) hours as needed for nausea or vomiting. Please keep your January for any further refills. Thank you   No facility-administered encounter medications on file as of 07/31/2022.   No Known Allergies Patient Active Problem List   Diagnosis Date Noted   Chronic nausea 07/31/2022   Chronic generalized abdominal pain 07/31/2022   Cannabis use disorder 07/31/2022   Social History   Socioeconomic History   Marital status: Single    Spouse name: Not on file   Number of children: Not on file   Years of education: Not on file   Highest education level: Not on file  Occupational History   Not on file  Tobacco Use  Smoking status: Never   Smokeless tobacco: Never  Vaping Use   Vaping Use: Every day  Substance and Sexual Activity   Alcohol use: Yes    Comment: occasionally   Drug use: Yes    Types: Marijuana    Comment: Daily   Sexual activity: Yes  Other Topics Concern   Not on file  Social History Narrative   Uses marijuana every day   Social Determinants of Health   Financial Resource Strain: Not on file  Food Insecurity: Not on file  Transportation Needs: Not on file  Physical Activity: Not on file   Stress: Not on file  Social Connections: Not on file  Intimate Partner Violence: Not on file    Ms. Alire's family history includes Breast cancer in her mother; Diabetes in her father.      Objective:    Vitals:   07/31/22 1420  BP: 110/62  Pulse: 97    Physical Exam Well-developed well-nourished young African-American female in no acute distress.  Accompanied by friend height, Weight, 156 BMI 25.1  HEENT; nontraumatic normocephalic, EOMI, PE R LA, sclera anicteric. Oropharynx; not examined today Neck; supple, no JVD Cardiovascular; regular rate and rhythm with S1-S2, no murmur rub or gallop Pulmonary; Clear bilaterally Abdomen; soft, nontender, nondistended, no palpable mass or hepatosplenomegaly, bowel sounds are active Rectal; not done today Skin; benign exam, no jaundice rash or appreciable lesions Extremities; no clubbing cyanosis or edema skin warm and dry Neuro/Psych; alert and oriented x4, grossly nonfocal mood and affect appropriate        Assessment & Plan:   #69 22 year old African-American female with chronic complaints of abdominal pain and nausea, intermittent vomiting. Symptoms are improved but not eradicated with Zofran 8 mg and dicyclomine 2-3 times daily. Symptoms are likely secondary to cannabis hyperemesis type picture with ongoing cannabis use though patient states not every day at this time. 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 Prior abdominal ultrasounds - September 2022  #2 history of iron deficiency anemia-no recent labs #3 menorrhagia likely source of iron deficiency anemia in a 22 year old.  Plan; CBC, iron studies Refill Zofran ODT 8 mg every 8 hours as needed for nausea Start Phenergan 25 mg tablet, take 1/2 tablet every 8 hours only for severe episodes of nausea vomiting not controlled by Zofran Patient is advised to  taper and discontinue cannabis use. Refill dicyclomine 20 mg 1 p.o. 3 times daily as needed She asks for refill on her inhaler as she did with her last office visit.  I gave her 1 refill Patient is advised to make an appointment to see a gynecologist. She needs a PCP.  She was given contact information for [facility] and asked to make an appointment to establish a primary care provider.  Jaaziel Peatross Genia Harold PA-C 07/31/2022   Cc: Pediatric[facility], [facility]

## 2022-07-31 NOTE — Patient Instructions (Signed)
_______________________________________________________  If your blood pressure at your visit was 140/90 or greater, please contact your primary care physician to follow up on this.  _______________________________________________________  If you are age 22 or older, your body mass index should be between 23-30. Your Body mass index is 25.18 kg/m. If this is out of the aforementioned range listed, please consider follow up with your Primary Care Provider.  If you are age 90 or younger, your body mass index should be between 19-25. Your Body mass index is 25.18 kg/m. If this is out of the aformentioned range listed, please consider follow up with your Primary Care Provider.   ________________________________________________________  The  GI providers would like to encourage you to use [facility] to communicate with providers for non-urgent requests or questions.  Due to long hold times on the telephone, sending your provider a message by [facility] may be a faster and more efficient way to get a response.  Please allow 48 business hours for a response.  Please remember that this is for non-urgent requests.  _______________________________________________________  Your provider has requested that you go to the basement level for lab work before leaving today. Press "B" on the elevator. The lab is located at the first door on the left as you exit the elevator.  We have sent the following medications to your pharmacy for you to pick up at your convenience:  Inhaler, Zofran, Phenergan, Bentyl  Get an appointment with a gynecologist  Call family practice:  [phone]  Follow up with Dr. Carlean Purl in 6 months

## 2022-08-03 ENCOUNTER — Ambulatory Visit (HOSPITAL_COMMUNITY)
Admission: EM | Admit: 2022-08-03 | Discharge: 2022-08-03 | Disposition: A | Discharge status: Home / Self Care | Payer: Medicaid Other | Attending: Emergency Medicine | Admitting: Emergency Medicine

## 2022-08-03 ENCOUNTER — Encounter (HOSPITAL_COMMUNITY): Payer: Self-pay | Admitting: Emergency Medicine

## 2022-08-03 ENCOUNTER — Ambulatory Visit (INDEPENDENT_AMBULATORY_CARE_PROVIDER_SITE_OTHER): Payer: Medicaid Other

## 2022-08-03 ENCOUNTER — Encounter: Payer: Self-pay | Admitting: Internal Medicine

## 2022-08-03 ENCOUNTER — Telehealth: Payer: Medicaid Other | Admitting: Nurse Practitioner

## 2022-08-03 DIAGNOSIS — K219 Gastro-esophageal reflux disease without esophagitis: Secondary | ICD-10-CM | POA: Diagnosis not present

## 2022-08-03 DIAGNOSIS — R42 Dizziness and giddiness: Secondary | ICD-10-CM

## 2022-08-03 DIAGNOSIS — R0602 Shortness of breath: Secondary | ICD-10-CM | POA: Diagnosis not present

## 2022-08-03 DIAGNOSIS — R061 Stridor: Secondary | ICD-10-CM | POA: Diagnosis not present

## 2022-08-03 DIAGNOSIS — R079 Chest pain, unspecified: Secondary | ICD-10-CM | POA: Diagnosis not present

## 2022-08-03 DIAGNOSIS — M940 Chondrocostal junction syndrome [Tietze]: Secondary | ICD-10-CM | POA: Diagnosis not present

## 2022-08-03 DIAGNOSIS — R062 Wheezing: Secondary | ICD-10-CM

## 2022-08-03 HISTORY — DX: Gastro-esophageal reflux disease without esophagitis: K21.9

## 2022-08-03 MED ORDER — LIDOCAINE VISCOUS HCL 2 % MT SOLN
OROMUCOSAL | Status: AC
  Filled 2022-08-03: qty 15

## 2022-08-03 MED ORDER — PANTOPRAZOLE SODIUM 20 MG PO TBEC: 20.0000 mg | DELAYED_RELEASE_TABLET | Freq: Every day | ORAL | 0 refills | Status: AC

## 2022-08-03 MED ORDER — NAPROXEN 500 MG PO TABS: 500.0000 mg | ORAL_TABLET | Freq: Two times a day (BID) | ORAL | 0 refills | Status: AC

## 2022-08-03 MED ORDER — IPRATROPIUM-ALBUTEROL 0.5-2.5 (3) MG/3ML IN SOLN
3.0000 mL | Freq: Once | RESPIRATORY_TRACT | Status: AC
  Administered 2022-08-03: 3 mL via RESPIRATORY_TRACT

## 2022-08-03 MED ORDER — DEXAMETHASONE SODIUM PHOSPHATE 10 MG/ML IJ SOLN
INTRAMUSCULAR | Status: AC
  Filled 2022-08-03: qty 1

## 2022-08-03 MED ORDER — FAMOTIDINE 20 MG PO TABS: 20.0000 mg | ORAL_TABLET | Freq: Two times a day (BID) | ORAL | 0 refills | Status: AC

## 2022-08-03 MED ORDER — ALUM & MAG HYDROXIDE-SIMETH 200-200-20 MG/5ML PO SUSP
30.0000 mL | Freq: Once | ORAL | Status: AC
  Administered 2022-08-03: 30 mL via ORAL

## 2022-08-03 MED ORDER — IPRATROPIUM-ALBUTEROL 0.5-2.5 (3) MG/3ML IN SOLN
RESPIRATORY_TRACT | Status: AC
  Filled 2022-08-03: qty 3

## 2022-08-03 MED ORDER — LIDOCAINE VISCOUS HCL 2 % MT SOLN
15.0000 mL | Freq: Once | OROMUCOSAL | Status: AC
  Administered 2022-08-03: 15 mL via OROMUCOSAL

## 2022-08-03 MED ORDER — ALUM & MAG HYDROXIDE-SIMETH 200-200-20 MG/5ML PO SUSP
ORAL | Status: AC
  Filled 2022-08-03: qty 30

## 2022-08-03 MED ORDER — ALBUTEROL SULFATE HFA 108 (90 BASE) MCG/ACT IN AERS: 1.0000 | INHALATION_SPRAY | RESPIRATORY_TRACT | 0 refills | Status: AC | PRN

## 2022-08-03 MED ORDER — AEROCHAMBER MV MISC: 1 refills | Status: AC

## 2022-08-03 MED ORDER — DEXAMETHASONE SODIUM PHOSPHATE 10 MG/ML IJ SOLN
10.0000 mg | Freq: Once | INTRAMUSCULAR | Status: AC
  Administered 2022-08-03: 10 mg via INTRAMUSCULAR

## 2022-08-03 NOTE — Patient Instructions (Signed)
  Michelle Mcfarland, thank you for joining Gildardo Pounds, NP for today's virtual visit.  While this provider is not your primary care provider (PCP), if your PCP is located in our provider database this encounter information will be shared with them immediately following your visit.   [facility] account gives you access to today's visit and all your visits, tests, and labs performed at [facility] " click here if you don't have a [facility] account or go to [email or web address]  Consent: (Patient) Michelle Mcfarland provided verbal consent for this virtual visit at the beginning of the encounter.  Current Medications:  Current Outpatient Medications:    albuterol (VENTOLIN HFA) 108 (90 Base) MCG/ACT inhaler, Inhale 1-2 puffs into the lungs every 6 (six) hours as needed for wheezing or shortness of breath., Disp: 18 g, Rfl: 0   dicyclomine (BENTYL) 20 MG tablet, Take 1 tablet (20 mg total) by mouth every 6 (six) hours as needed for spasms., Disp: 120 tablet, Rfl: 6   Iron, Ferrous Sulfate, 325 (65 Fe) MG TABS, Take 1 tablet by mouth daily., Disp: 30 tablet, Rfl: 8   ondansetron (ZOFRAN-ODT) 8 MG disintegrating tablet, Take 1 tablet (8 mg total) by mouth every 8 (eight) hours as needed for nausea or vomiting. Please keep your January for any further refills. Thank you, Disp: 30 tablet, Rfl: 0   promethazine (PHENERGAN) 25 MG tablet, Take 1/2 tablet every 6 hours as needed for severe nausea only, Disp: 20 tablet, Rfl: 2   Medications ordered in this encounter:  No orders of the defined types were placed in this encounter.    *If you need refills on other medications prior to your next appointment, please contact your pharmacy*  Follow-Up: Call back or seek an in-person evaluation if the symptoms worsen or if the condition fails to improve as anticipated.  [place] [phone]  Other Instructions SEEK EMERGENT EVALUATION   If you have been  instructed to have an in-person evaluation today at a local Urgent Care facility, please use the link below. It will take you to a list of all of our available [facility], including address, phone number and hours of operation. Please do not delay care.  [facility]  If you or a family member do not have a primary care provider, use the link below to schedule a visit and establish care. When you choose a [facility] primary care physician or advanced practice provider, you gain a long-term partner in health. Find a Primary Care Provider  Learn more about [facility] in-office and virtual care options: [facility]

## 2022-08-03 NOTE — ED Provider Notes (Signed)
HPI  SUBJECTIVE:  Michelle Mcfarland is a 22 y.o. female who presents with substernal sharp, stabbing, constant chest pain with left arm "soreness", shortness of breath, wheezing, dry cough, states that her throat feels itchy, as if it is contracting.  She states that it is difficult to breathe.  She also reports waterbrash, nausea, palpitations.  Symptoms started at 10 AM this morning.  No sore throat, new food, drink, recent antibiotics, change in her medications, hives, skin flushing, lip or tongue swelling, voice changes.  No diaphoresis, change in physical activity, trauma to her chest.  No calf pain, swelling, OCP use.  Tried her mother's muscle relaxant this morning and 2 puffs from her albuterol inhaler without improvement in her symptoms.  No alleviating factors.  Symptoms worse with lying down, bending forward, and with exertion.  She has had symptoms like this before with anxiety.  She has a past medical history of asthma, with no admissions, seasonal allergies, GERD but does not take any medication for it, anxiety, smokes marijuana and tobacco.  She denies smoking this morning.  No history of anaphylaxis, DVT, PE, MI, coronary disease, diabetes, hypertension, HIV, hypercholesterolemia, CVA.  Family history negative for early MI.  LMP: 2 weeks ago.  Denies the possibility being pregnant.  [facility]: [facility].   Past Medical History:  Diagnosis Date   Acid reflux    Asthma    Hypercalcemia    Iron deficiency anemia     History reviewed. No pertinent surgical history.  Family History  Problem Relation Age of Onset   Breast cancer Mother    Diabetes Father    Colon cancer Neg Hx    Esophageal cancer Neg Hx    Rectal cancer Neg Hx    Stomach cancer Neg Hx     Social History   Tobacco Use   Smoking status: Every Day    Types: Cigarettes   Smokeless tobacco: Never  Vaping Use   Vaping Use: Every day  Substance Use Topics   Alcohol use: Yes    Comment: occasionally   Drug use:  Yes    Types: Marijuana    Comment: Daily    No current facility-administered medications for this encounter.  Current Outpatient Medications:    albuterol (VENTOLIN HFA) 108 (90 Base) MCG/ACT inhaler, Inhale 1-2 puffs into the lungs every 4 (four) hours as needed for wheezing or shortness of breath., Disp: 1 each, Rfl: 0   dicyclomine (BENTYL) 20 MG tablet, Take 1 tablet (20 mg total) by mouth every 6 (six) hours as needed for spasms., Disp: 120 tablet, Rfl: 6   famotidine (PEPCID) 20 MG tablet, Take 1 tablet (20 mg total) by mouth 2 (two) times daily., Disp: 40 tablet, Rfl: 0   naproxen (NAPROSYN) 500 MG tablet, Take 1 tablet (500 mg total) by mouth 2 (two) times daily., Disp: 20 tablet, Rfl: 0   ondansetron (ZOFRAN-ODT) 8 MG disintegrating tablet, Take 1 tablet (8 mg total) by mouth every 8 (eight) hours as needed for nausea or vomiting. Please keep your January for any further refills. Thank you, Disp: 30 tablet, Rfl: 0   pantoprazole (PROTONIX) 20 MG tablet, Take 1 tablet (20 mg total) by mouth daily., Disp: 30 tablet, Rfl: 0   Spacer/Aero-Holding Chambers (AEROCHAMBER MV) inhaler, Use as instructed, Disp: 1 each, Rfl: 1   Iron, Ferrous Sulfate, 325 (65 Fe) MG TABS, Take 1 tablet by mouth daily., Disp: 30 tablet, Rfl: 8   promethazine (PHENERGAN) 25 MG tablet, Take 1/2  tablet every 6 hours as needed for severe nausea only, Disp: 20 tablet, Rfl: 2  No Known Allergies   ROS  As noted in HPI.   Physical Exam  BP (!) 123/91 (BP Location: Left Arm)   Pulse 85   Temp 98.7 F (37.1 C)   Resp 20   LMP 07/27/2022   SpO2 99%   Constitutional: Well developed, well nourished, appears slightly anxious.  Inspiratory wheezing.  Normal voice.  Speaking in full sentences. Eyes:  EOMI, conjunctiva normal bilaterally HENT: Normocephalic, atraumatic,mucus membranes moist.  Normal oropharynx.  No drooling, trismus, stridor.  No angioedema of the lips or tongue Respiratory: Normal inspiratory  effort, lungs clear bilaterally.  Positive substernal, left-sided chest wall tenderness.  Pain reproduced with palpation, arm movement, torso rotation Cardiovascular: Regular rate, rhythm, no murmurs, rubs, gallops GI: nondistended skin: No rash, urticaria, flushing, skin intact Musculoskeletal: Calves symmetric, nontender, no edema. Neurologic: Alert & oriented x 3, no focal neuro deficits Psychiatric: Speech and behavior appropriate   ED Course   Medications  alum & mag hydroxide-simeth (MAALOX/MYLANTA) 200-200-20 MG/5ML suspension 30 mL (30 mLs Oral Given 08/03/22 1406)  lidocaine (XYLOCAINE) 2 % viscous mouth solution 15 mL (15 mLs Mouth/Throat Given 08/03/22 1406)  ipratropium-albuterol (DUONEB) 0.5-2.5 (3) MG/3ML nebulizer solution 3 mL (3 mLs Nebulization Given 08/03/22 1406)  dexamethasone (DECADRON) injection 10 mg (10 mg Intramuscular Given 08/03/22 1431)    Orders Placed This Encounter  Procedures   DG Chest 2 View    Standing Status:   Standing    Number of Occurrences:   1    Order Specific Question:   Reason for Exam (SYMPTOM  OR DIAGNOSIS REQUIRED)    Answer:   substernal CP starting this am r/o acute cardiopoumonary process   DG Neck Soft Tissue    Standing Status:   Standing    Number of Occurrences:   1    Order Specific Question:   Reason for Exam (SYMPTOM  OR DIAGNOSIS REQUIRED)    Answer:   insporatory sridor no ST   ED EKG    Standing Status:   Standing    Number of Occurrences:   1    Order Specific Question:   Reason for Exam    Answer:   Chest Pain   EKG 12-Lead    Standing Status:   Standing    Number of Occurrences:   1    No results found for this or any previous visit (from the past 24 hour(s)). DG Neck Soft Tissue  Result Date: 08/03/2022 CLINICAL DATA:  Inspiratory stridor EXAM: NECK SOFT TISSUES - 1+ VIEW COMPARISON:  None Available. FINDINGS: There is no evidence of retropharyngeal soft tissue swelling or epiglottic enlargement. The cervical  airway is unremarkable and no radio-opaque foreign body identified. IMPRESSION: Negative. Electronically Signed   By: Kathreen Devoid M.D.   On: 08/03/2022 14:59   DG Chest 2 View  Result Date: 08/03/2022 CLINICAL DATA:  Chest pain, shortness of breath, and lightheadedness beginning this morning. EXAM: CHEST - 2 VIEW COMPARISON:  11/22/2020 FINDINGS: The heart size and mediastinal contours are within normal limits. Both lungs are clear. The visualized skeletal structures are unremarkable. IMPRESSION: No active cardiopulmonary disease. Electronically Signed   By: Marlaine Hind M.D.   On: 08/03/2022 14:21    ED Clinical Impression  1. Costochondritis   2. Wheezing   3. Gastroesophageal reflux disease without esophagitis      ED Assessment/Plan     Patient presents  with substernal, and left-sided chest pain and left arm soreness.  However, she has reproducible chest wall tenderness with palpation, arm movement, torso rotation.  No evidence of angioedema, diaphoresis, no urticaria or flushing that would be consistent with an anaphylactic reaction.  Patient has no cardiac risk factors other than smoking tobacco. Patient is PERC negative.  Doubt PE.  she does have some inspiratory stridor, but is speaking in full sentences, no drooling, trismus.  There does not appear to be any evidence of impending airway obstruction at this time.  Could be acid reflux, so giving GI cocktail.  Doubt vocal cord paralysis.  She could also be having an asthma exacerbation, so will try a DuoNeb.  Dexamethasone 10 mg IM for asthma and any possible airway involvement.  Checking EKG, chest x-ray.  Will reevaluate.  HEART score:   History: Slightly suspicious 0 EKG: Normal 0 Age: Below 45 0 Risk factors: 1 smoking +1 Troponin: Not available Total : 1.  EKG: Sinus rhythm with arrhythmia.  Rate 80.  Right axis deviation.  Normal intervals, no hypertrophy.  No ST elevation.  No previous EKG for comparison.  Patient  symptomatic while EKG was obtained.  Reviewed imaging independently.  Normal chest x-ray.  See radiology report for full details.  Reevaluation, patient states that she feels better after the GI cocktail and DuoNeb, still has some inspiratory stridor.  She has no stridor when asked to breathe through an open mouth.  Will check a lateral soft tissue neck.  EKG has no ischemic changes, chest x-ray negative for acute cardiopulmonary process.  Soft tissue neck normal.  On reevaluation, no stridor.  I suspect acid reflux/musculoskeletal chest pain at this point in time.  There does not appear to be any evidence of an acute life-threatening process at this time particularly of the airway.  Will send home with Pepcid/Protonix, refill her albuterol inhaler, give her spacer for her to use every 4-6 hours as needed.  Will try some Naprosyn/Tylenol.  The dexamethasone will continue to work for 3 days.  Follow-up with PCP as needed.  Strict ER return precautions given.  Discussed EKG, imaging, MDM, treatment plan, and plan for follow-up with patient. Discussed sn/sx that should prompt return to the ED. patient agrees with plan.   Meds ordered this encounter  Medications   alum & mag hydroxide-simeth (MAALOX/MYLANTA) 200-200-20 MG/5ML suspension 30 mL   lidocaine (XYLOCAINE) 2 % viscous mouth solution 15 mL   ipratropium-albuterol (DUONEB) 0.5-2.5 (3) MG/3ML nebulizer solution 3 mL   dexamethasone (DECADRON) injection 10 mg   pantoprazole (PROTONIX) 20 MG tablet    Sig: Take 1 tablet (20 mg total) by mouth daily.    Dispense:  30 tablet    Refill:  0   famotidine (PEPCID) 20 MG tablet    Sig: Take 1 tablet (20 mg total) by mouth 2 (two) times daily.    Dispense:  40 tablet    Refill:  0   albuterol (VENTOLIN HFA) 108 (90 Base) MCG/ACT inhaler    Sig: Inhale 1-2 puffs into the lungs every 4 (four) hours as needed for wheezing or shortness of breath.    Dispense:  1 each    Refill:  0    Spacer/Aero-Holding Chambers (AEROCHAMBER MV) inhaler    Sig: Use as instructed    Dispense:  1 each    Refill:  1   naproxen (NAPROSYN) 500 MG tablet    Sig: Take 1 tablet (500 mg total) by mouth 2 (  two) times daily.    Dispense:  20 tablet    Refill:  0      *This clinic note was created using [employer]. Therefore, there may be occasional mistakes despite careful proofreading.  ?    Melynda Ripple, MD 08/03/22 [phone]

## 2022-08-03 NOTE — Progress Notes (Signed)
Virtual Visit Consent   Michelle Mcfarland, you are scheduled for a virtual visit with a [facility] provider today. Just as with appointments in the office, your consent must be obtained to participate. Your consent will be active for this visit and any virtual visit you may have with one of our providers in the next 365 days. If you have a MyChart account, a copy of this consent can be sent to you electronically.  As this is a virtual visit, video technology does not allow for your provider to perform a traditional examination. This may limit your provider's ability to fully assess your condition. If your provider identifies any concerns that need to be evaluated in person or the need to arrange testing (such as labs, EKG, etc.), we will make arrangements to do so. Although advances in technology are sophisticated, we cannot ensure that it will always work on either your end or our end. If the connection with a video visit is poor, the visit may have to be switched to a telephone visit. With either a video or telephone visit, we are not always able to ensure that we have a secure connection.  By engaging in this virtual visit, you consent to the provision of healthcare and authorize for your insurance to be billed (if applicable) for the services provided during this visit. Depending on your insurance coverage, you may receive a charge related to this service.  I need to obtain your verbal consent now. Are you willing to proceed with your visit today? Michelle Mcfarland has provided verbal consent on 08/03/2022 for a virtual visit (video or telephone). Gildardo Pounds, NP  Date: 08/03/2022 11:50 AM  Virtual Visit via Video Note   I, Gildardo Pounds, connected with  Michelle Mcfarland  ([id number], 2001/03/16) on 08/03/22 at 11:45 AM EST by a video-enabled telemedicine application and verified that I am speaking with the correct person using two identifiers.  Location: Patient: Virtual Visit Location Patient:  Home Provider: Virtual Visit Location Provider: Home Office   I discussed the limitations of evaluation and management by telemedicine and the availability of in person appointments. The patient expressed understanding and agreed to proceed.    History of Present Illness: Michelle Mcfarland is a 22 y.o. who identifies as a female who was assigned female at birth, and is being seen today for acute onset of shortness of breath and chest pain with difficulty breathing and feeling as if throat is closing in.   Problems:  Patient Active Problem List   Diagnosis Date Noted   Chronic nausea 07/31/2022   Chronic generalized abdominal pain 07/31/2022   Cannabis use disorder 07/31/2022    Allergies: No Known Allergies Medications:  Current Outpatient Medications:    albuterol (VENTOLIN HFA) 108 (90 Base) MCG/ACT inhaler, Inhale 1-2 puffs into the lungs every 6 (six) hours as needed for wheezing or shortness of breath., Disp: 18 g, Rfl: 0   dicyclomine (BENTYL) 20 MG tablet, Take 1 tablet (20 mg total) by mouth every 6 (six) hours as needed for spasms., Disp: 120 tablet, Rfl: 6   Iron, Ferrous Sulfate, 325 (65 Fe) MG TABS, Take 1 tablet by mouth daily., Disp: 30 tablet, Rfl: 8   ondansetron (ZOFRAN-ODT) 8 MG disintegrating tablet, Take 1 tablet (8 mg total) by mouth every 8 (eight) hours as needed for nausea or vomiting. Please keep your January for any further refills. Thank you, Disp: 30 tablet, Rfl: 0   promethazine (PHENERGAN) 25 MG tablet, Take 1/2 tablet  every 6 hours as needed for severe nausea only, Disp: 20 tablet, Rfl: 2  Observations/Objective: Patient is well-developed, well-nourished in no acute distress.  Resting comfortably at home.  Head is normocephalic, atraumatic.  No labored breathing.  Partner is speaking on her behalf  Patient is alert and oriented at baseline.    Assessment and Plan: 1. Chest pain, unspecified type Needs to seek emergency evaluation  Follow Up  Instructions: I discussed the assessment and treatment plan with the patient. The patient was provided an opportunity to ask questions and all were answered. The patient agreed with the plan and demonstrated an understanding of the instructions.  A copy of instructions were sent to the patient via MyChart unless otherwise noted below.    The patient was advised to call back or seek an in-person evaluation if the symptoms worsen or if the condition fails to improve as anticipated.  Time:  I spent 6 minutes with the patient via telehealth technology discussing the above problems/concerns.    Gildardo Pounds, NP

## 2022-08-03 NOTE — ED Triage Notes (Signed)
Pt is here for wheezing , chest feels tight , SOB , light headedness , throat feels like its closing

## 2022-08-03 NOTE — Discharge Instructions (Signed)
Your chest x-ray, neck x-ray and EKG were all unremarkable.  I suspect that this is a combination of acid reflux, musculoskeletal chest pain and asthma exacerbation.  Dexamethasone will last for 3 days.  He do not need any further steroids.  Try the Pepcid, Protonix, 2 puffs from your albuterol inhaler using your spacer every 4 hours as needed.  Naprosyn/Tylenol together twice a day to help with pain.  Follow-up with your primary care provider.  Go to the ER with the things that we discussed, and if you get worse, cannot breathe or swallow, voice changes, or for any concerns.

## 2022-08-08 ENCOUNTER — Other Ambulatory Visit: Payer: Self-pay

## 2022-08-08 DIAGNOSIS — D509 Iron deficiency anemia, unspecified: Secondary | ICD-10-CM

## 2022-08-08 MED ORDER — IRON (FERROUS SULFATE) 325 (65 FE) MG PO TABS: 1.0000 | ORAL_TABLET | Freq: Every day | ORAL | 8 refills | Status: AC

## 2023-02-17 ENCOUNTER — Telehealth: Payer: Self-pay | Admitting: Physician Assistant

## 2023-02-17 NOTE — Telephone Encounter (Signed)
Inbound call from patient stating she need promethazine, dicyclomine, and ondansetron medication refill at [facility] on [address]. Please advise, thank you.

## 2023-02-18 MED ORDER — ONDANSETRON 8 MG PO TBDP: 8.0000 mg | ORAL_TABLET | Freq: Three times a day (TID) | ORAL | 5 refills | Status: AC | PRN

## 2023-02-18 MED ORDER — DICYCLOMINE HCL 20 MG PO TABS: 20.0000 mg | ORAL_TABLET | Freq: Four times a day (QID) | ORAL | 5 refills | Status: AC | PRN

## 2023-02-18 MED ORDER — PROMETHAZINE HCL 25 MG PO TABS: ORAL_TABLET | ORAL | 5 refills | Status: AC

## 2023-02-18 NOTE — Telephone Encounter (Signed)
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

## 2023-02-18 NOTE — Telephone Encounter (Signed)
Rx refills for dicyclomine, ondansetron, promethazine all sent to [facility] on [address] as requested.
